# Patient Record
Sex: Female | Born: 1935 | Race: White | Hispanic: No | Smoking: Never smoker
Health system: Southern US, Community
[De-identification: ages and names within clinical notes are randomized; demographics above are authoritative.]

## PROBLEM LIST (undated history)

## (undated) DIAGNOSIS — D649 Anemia, unspecified: Secondary | ICD-10-CM

## (undated) DIAGNOSIS — M1990 Unspecified osteoarthritis, unspecified site: Secondary | ICD-10-CM

## (undated) DIAGNOSIS — F0390 Unspecified dementia without behavioral disturbance: Secondary | ICD-10-CM

## (undated) DIAGNOSIS — I252 Old myocardial infarction: Secondary | ICD-10-CM

## (undated) DIAGNOSIS — M6282 Rhabdomyolysis: Secondary | ICD-10-CM

## (undated) DIAGNOSIS — E119 Type 2 diabetes mellitus without complications: Secondary | ICD-10-CM

## (undated) DIAGNOSIS — I10 Essential (primary) hypertension: Secondary | ICD-10-CM

## (undated) DIAGNOSIS — E785 Hyperlipidemia, unspecified: Secondary | ICD-10-CM

## (undated) HISTORY — PX: APPENDECTOMY: SHX54

## (undated) HISTORY — DX: Hyperlipidemia, unspecified: E78.5

## (undated) HISTORY — DX: Old myocardial infarction: I25.2

## (undated) HISTORY — PX: OTHER SURGICAL HISTORY: SHX169

## (undated) HISTORY — DX: Essential (primary) hypertension: I10

---

## 1998-02-28 ENCOUNTER — Encounter: Payer: Self-pay | Admitting: Orthopedic Surgery

## 1998-02-28 ENCOUNTER — Inpatient Hospital Stay (HOSPITAL_COMMUNITY): Admission: EM | Admit: 1998-02-28 | Discharge: 1998-03-06 | Payer: Self-pay | Admitting: Emergency Medicine

## 1998-02-28 ENCOUNTER — Encounter: Payer: Self-pay | Admitting: *Deleted

## 1998-03-02 ENCOUNTER — Encounter: Payer: Self-pay | Admitting: Orthopedic Surgery

## 1998-03-06 ENCOUNTER — Inpatient Hospital Stay (HOSPITAL_COMMUNITY)
Admission: RE | Admit: 1998-03-06 | Discharge: 1998-04-09 | Payer: Self-pay | Admitting: Physical Medicine and Rehabilitation

## 1998-03-16 ENCOUNTER — Encounter: Payer: Self-pay | Admitting: Gastroenterology

## 1998-03-30 ENCOUNTER — Encounter: Payer: Self-pay | Admitting: Physical Medicine and Rehabilitation

## 1998-05-15 ENCOUNTER — Encounter: Payer: Self-pay | Admitting: Orthopedic Surgery

## 1998-05-15 ENCOUNTER — Inpatient Hospital Stay (HOSPITAL_COMMUNITY): Admission: RE | Admit: 1998-05-15 | Discharge: 1998-05-18 | Payer: Self-pay | Admitting: Orthopedic Surgery

## 1998-05-18 ENCOUNTER — Inpatient Hospital Stay (HOSPITAL_COMMUNITY)
Admission: RE | Admit: 1998-05-18 | Discharge: 1998-05-26 | Payer: Self-pay | Admitting: Physical Medicine & Rehabilitation

## 1999-02-05 ENCOUNTER — Other Ambulatory Visit: Admission: RE | Admit: 1999-02-05 | Discharge: 1999-02-05 | Payer: Self-pay | Admitting: Gastroenterology

## 1999-04-29 ENCOUNTER — Encounter: Admission: RE | Admit: 1999-04-29 | Discharge: 1999-04-29 | Payer: Self-pay | Admitting: Gastroenterology

## 1999-04-29 ENCOUNTER — Encounter: Payer: Self-pay | Admitting: Gastroenterology

## 2000-04-14 ENCOUNTER — Other Ambulatory Visit: Admission: RE | Admit: 2000-04-14 | Discharge: 2000-04-14 | Payer: Self-pay | Admitting: Gastroenterology

## 2000-05-11 ENCOUNTER — Encounter: Payer: Self-pay | Admitting: Gastroenterology

## 2000-05-11 ENCOUNTER — Encounter: Admission: RE | Admit: 2000-05-11 | Discharge: 2000-05-11 | Payer: Self-pay | Admitting: Gastroenterology

## 2003-09-04 ENCOUNTER — Other Ambulatory Visit: Admission: RE | Admit: 2003-09-04 | Discharge: 2003-09-04 | Payer: Self-pay | Admitting: Gastroenterology

## 2004-03-23 ENCOUNTER — Encounter: Admission: RE | Admit: 2004-03-23 | Discharge: 2004-03-23 | Payer: Self-pay | Admitting: Gastroenterology

## 2004-03-24 ENCOUNTER — Ambulatory Visit (HOSPITAL_COMMUNITY): Admission: RE | Admit: 2004-03-24 | Discharge: 2004-03-24 | Payer: Self-pay | Admitting: General Surgery

## 2004-03-25 ENCOUNTER — Ambulatory Visit (HOSPITAL_COMMUNITY): Admission: RE | Admit: 2004-03-25 | Discharge: 2004-03-25 | Payer: Self-pay | Admitting: Gastroenterology

## 2006-05-30 ENCOUNTER — Other Ambulatory Visit: Admission: RE | Admit: 2006-05-30 | Discharge: 2006-05-30 | Payer: Self-pay | Admitting: Gastroenterology

## 2006-06-06 ENCOUNTER — Encounter: Admission: RE | Admit: 2006-06-06 | Discharge: 2006-06-06 | Payer: Self-pay | Admitting: Gastroenterology

## 2008-02-08 HISTORY — PX: OTHER SURGICAL HISTORY: SHX169

## 2008-05-08 ENCOUNTER — Inpatient Hospital Stay (HOSPITAL_COMMUNITY): Admission: EM | Admit: 2008-05-08 | Discharge: 2008-05-19 | Payer: Self-pay | Admitting: Emergency Medicine

## 2008-05-08 ENCOUNTER — Encounter (INDEPENDENT_AMBULATORY_CARE_PROVIDER_SITE_OTHER): Payer: Self-pay | Admitting: Cardiology

## 2008-05-11 ENCOUNTER — Encounter (INDEPENDENT_AMBULATORY_CARE_PROVIDER_SITE_OTHER): Payer: Self-pay | Admitting: Cardiology

## 2008-05-16 ENCOUNTER — Ambulatory Visit: Payer: Self-pay | Admitting: Physical Medicine & Rehabilitation

## 2008-06-04 ENCOUNTER — Ambulatory Visit: Payer: Self-pay | Admitting: Specialist

## 2008-08-12 ENCOUNTER — Encounter: Admission: RE | Admit: 2008-08-12 | Discharge: 2008-08-12 | Payer: Self-pay | Admitting: Gastroenterology

## 2009-09-01 ENCOUNTER — Encounter: Admission: RE | Admit: 2009-09-01 | Discharge: 2009-09-01 | Payer: Self-pay | Admitting: Geriatric Medicine

## 2010-03-15 ENCOUNTER — Other Ambulatory Visit: Payer: Self-pay | Admitting: Geriatric Medicine

## 2010-03-15 DIAGNOSIS — N289 Disorder of kidney and ureter, unspecified: Secondary | ICD-10-CM

## 2010-03-19 ENCOUNTER — Ambulatory Visit
Admission: RE | Admit: 2010-03-19 | Discharge: 2010-03-19 | Disposition: A | Discharge status: Home / Self Care | Payer: MEDICARE | Source: Ambulatory Visit | Attending: Geriatric Medicine | Admitting: Geriatric Medicine

## 2010-03-19 DIAGNOSIS — N289 Disorder of kidney and ureter, unspecified: Secondary | ICD-10-CM

## 2010-05-19 LAB — BASIC METABOLIC PANEL
BUN: 22 mg/dL (ref 6–23)
CO2: 25 mEq/L (ref 19–32)
CO2: 25 mEq/L (ref 19–32)
Calcium: 8.6 mg/dL (ref 8.4–10.5)
Calcium: 8.9 mg/dL (ref 8.4–10.5)
Chloride: 97 mEq/L (ref 96–112)
Creatinine, Ser: 2.03 mg/dL — ABNORMAL HIGH (ref 0.4–1.2)
Creatinine, Ser: 2.1 mg/dL — ABNORMAL HIGH (ref 0.4–1.2)
GFR calc Af Amer: 27 mL/min — ABNORMAL LOW (ref >60)
GFR calc Af Amer: 28 mL/min — ABNORMAL LOW (ref >60)
GFR calc non Af Amer: 22 mL/min — ABNORMAL LOW (ref >60)
GFR calc non Af Amer: 23 mL/min — ABNORMAL LOW (ref >60)
Glucose, Bld: 104 mg/dL — ABNORMAL HIGH (ref 70–99)
Glucose, Bld: 104 mg/dL — ABNORMAL HIGH (ref 70–99)
Glucose, Bld: 111 mg/dL — ABNORMAL HIGH (ref 70–99)
Potassium: 4.4 mEq/L (ref 3.5–5.1)
Sodium: 133 mEq/L — ABNORMAL LOW (ref 135–145)
Sodium: 135 mEq/L (ref 135–145)
Sodium: 138 mEq/L (ref 135–145)

## 2010-05-19 LAB — CARDIAC PANEL(CRET KIN+CKTOT+MB+TROPI)
CK, MB: 47.5 ng/mL — ABNORMAL HIGH (ref 0.3–4.0)
CK, MB: 93.4 ng/mL — ABNORMAL HIGH (ref 0.3–4.0)
Relative Index: 12.6 — ABNORMAL HIGH (ref 0.0–2.5)
Relative Index: 8.3 — ABNORMAL HIGH (ref 0.0–2.5)
Relative Index: 9.8 — ABNORMAL HIGH (ref 0.0–2.5)
Total CK: 573 U/L — ABNORMAL HIGH (ref 7–177)
Troponin I: 1.54 ng/mL (ref 0.00–0.06)
Troponin I: 20.76 ng/mL (ref 0.00–0.06)
Troponin I: 29.9 ng/mL (ref 0.00–0.06)
Troponin I: 8.6 ng/mL (ref 0.00–0.06)

## 2010-05-19 LAB — CBC
HCT: 28.6 % — ABNORMAL LOW (ref 36.0–46.0)
HCT: 29.2 % — ABNORMAL LOW (ref 36.0–46.0)
HCT: 32.9 % — ABNORMAL LOW (ref 36.0–46.0)
HCT: 34.8 % — ABNORMAL LOW (ref 36.0–46.0)
HCT: 38.8 % (ref 36.0–46.0)
HCT: 40.2 % (ref 36.0–46.0)
Hemoglobin: 10.1 g/dL — ABNORMAL LOW (ref 12.0–15.0)
Hemoglobin: 10.2 g/dL — ABNORMAL LOW (ref 12.0–15.0)
Hemoglobin: 11.1 g/dL — ABNORMAL LOW (ref 12.0–15.0)
Hemoglobin: 11.7 g/dL — ABNORMAL LOW (ref 12.0–15.0)
Hemoglobin: 13 g/dL (ref 12.0–15.0)
Hemoglobin: 13.3 g/dL (ref 12.0–15.0)
Hemoglobin: 9.9 g/dL — ABNORMAL LOW (ref 12.0–15.0)
MCHC: 31.9 g/dL (ref 30.0–36.0)
MCHC: 33.7 g/dL (ref 30.0–36.0)
MCHC: 34 g/dL (ref 30.0–36.0)
MCHC: 34.3 g/dL (ref 30.0–36.0)
MCHC: 34.5 g/dL (ref 30.0–36.0)
MCV: 86 fL (ref 78.0–100.0)
MCV: 86.4 fL (ref 78.0–100.0)
MCV: 86.5 fL (ref 78.0–100.0)
MCV: 86.8 fL (ref 78.0–100.0)
MCV: 86.8 fL (ref 78.0–100.0)
MCV: 87.3 fL (ref 78.0–100.0)
Platelets: 215 10*3/uL (ref 150–400)
Platelets: 218 10*3/uL (ref 150–400)
Platelets: 232 10*3/uL (ref 150–400)
Platelets: 241 10*3/uL (ref 150–400)
Platelets: 257 10*3/uL (ref 150–400)
Platelets: 258 10*3/uL (ref 150–400)
Platelets: 260 10*3/uL (ref 150–400)
RBC: 3.16 MIL/uL — ABNORMAL LOW (ref 3.87–5.11)
RBC: 3.39 MIL/uL — ABNORMAL LOW (ref 3.87–5.11)
RBC: 3.44 MIL/uL — ABNORMAL LOW (ref 3.87–5.11)
RBC: 3.79 MIL/uL — ABNORMAL LOW (ref 3.87–5.11)
RBC: 4.3 MIL/uL (ref 3.87–5.11)
RBC: 4.65 MIL/uL (ref 3.87–5.11)
RDW: 15.1 % (ref 11.5–15.5)
RDW: 15.4 % (ref 11.5–15.5)
RDW: 15.5 % (ref 11.5–15.5)
RDW: 15.9 % — ABNORMAL HIGH (ref 11.5–15.5)
RDW: 16 % — ABNORMAL HIGH (ref 11.5–15.5)
WBC: 12.2 10*3/uL — ABNORMAL HIGH (ref 4.0–10.5)
WBC: 5.5 10*3/uL (ref 4.0–10.5)
WBC: 5.5 10*3/uL (ref 4.0–10.5)
WBC: 6.1 10*3/uL (ref 4.0–10.5)
WBC: 7.1 10*3/uL (ref 4.0–10.5)
WBC: 7.3 10*3/uL (ref 4.0–10.5)
WBC: 7.7 10*3/uL (ref 4.0–10.5)

## 2010-05-19 LAB — URINE CULTURE
Colony Count: NO GROWTH
Culture: NO GROWTH

## 2010-05-19 LAB — GLUCOSE, CAPILLARY
Glucose-Capillary: 102 mg/dL — ABNORMAL HIGH (ref 70–99)
Glucose-Capillary: 106 mg/dL — ABNORMAL HIGH (ref 70–99)
Glucose-Capillary: 107 mg/dL — ABNORMAL HIGH (ref 70–99)
Glucose-Capillary: 110 mg/dL — ABNORMAL HIGH (ref 70–99)
Glucose-Capillary: 111 mg/dL — ABNORMAL HIGH (ref 70–99)
Glucose-Capillary: 111 mg/dL — ABNORMAL HIGH (ref 70–99)
Glucose-Capillary: 112 mg/dL — ABNORMAL HIGH (ref 70–99)
Glucose-Capillary: 112 mg/dL — ABNORMAL HIGH (ref 70–99)
Glucose-Capillary: 112 mg/dL — ABNORMAL HIGH (ref 70–99)
Glucose-Capillary: 115 mg/dL — ABNORMAL HIGH (ref 70–99)
Glucose-Capillary: 116 mg/dL — ABNORMAL HIGH (ref 70–99)
Glucose-Capillary: 125 mg/dL — ABNORMAL HIGH (ref 70–99)
Glucose-Capillary: 130 mg/dL — ABNORMAL HIGH (ref 70–99)
Glucose-Capillary: 134 mg/dL — ABNORMAL HIGH (ref 70–99)
Glucose-Capillary: 135 mg/dL — ABNORMAL HIGH (ref 70–99)
Glucose-Capillary: 135 mg/dL — ABNORMAL HIGH (ref 70–99)
Glucose-Capillary: 146 mg/dL — ABNORMAL HIGH (ref 70–99)
Glucose-Capillary: 170 mg/dL — ABNORMAL HIGH (ref 70–99)
Glucose-Capillary: 181 mg/dL — ABNORMAL HIGH (ref 70–99)
Glucose-Capillary: 186 mg/dL — ABNORMAL HIGH (ref 70–99)
Glucose-Capillary: 194 mg/dL — ABNORMAL HIGH (ref 70–99)
Glucose-Capillary: 206 mg/dL — ABNORMAL HIGH (ref 70–99)
Glucose-Capillary: 220 mg/dL — ABNORMAL HIGH (ref 70–99)
Glucose-Capillary: 228 mg/dL — ABNORMAL HIGH (ref 70–99)
Glucose-Capillary: 98 mg/dL (ref 70–99)

## 2010-05-19 LAB — HEPARIN LEVEL (UNFRACTIONATED)
Heparin Unfractionated: 0.4 IU/mL (ref 0.30–0.70)
Heparin Unfractionated: 0.6 IU/mL (ref 0.30–0.70)
Heparin Unfractionated: <0.1 IU/mL — ABNORMAL LOW (ref 0.30–0.70)
Heparin Unfractionated: <0.1 IU/mL — ABNORMAL LOW (ref 0.30–0.70)
Heparin Unfractionated: <0.1 IU/mL — ABNORMAL LOW (ref 0.30–0.70)
Heparin Unfractionated: <0.1 IU/mL — ABNORMAL LOW (ref 0.30–0.70)
Heparin Unfractionated: <0.1 IU/mL — ABNORMAL LOW (ref 0.30–0.70)

## 2010-05-19 LAB — URINALYSIS, ROUTINE W REFLEX MICROSCOPIC
Bilirubin Urine: NEGATIVE
Glucose, UA: NEGATIVE mg/dL
Ketones, ur: NEGATIVE mg/dL
Nitrite: NEGATIVE
Protein, ur: 100 mg/dL — AB
Protein, ur: >300 mg/dL — AB
Urobilinogen, UA: 0.2 mg/dL (ref 0.0–1.0)
pH: 7.5 (ref 5.0–8.0)

## 2010-05-19 LAB — COMPREHENSIVE METABOLIC PANEL
ALT: 11 U/L (ref 0–35)
ALT: 15 U/L (ref 0–35)
AST: 25 U/L (ref 0–37)
AST: 56 U/L — ABNORMAL HIGH (ref 0–37)
Albumin: 2.8 g/dL — ABNORMAL LOW (ref 3.5–5.2)
Albumin: 3.7 g/dL (ref 3.5–5.2)
Alkaline Phosphatase: 63 U/L (ref 39–117)
Calcium: 9.3 mg/dL (ref 8.4–10.5)
Chloride: 101 mEq/L (ref 96–112)
Creatinine, Ser: 1.71 mg/dL — ABNORMAL HIGH (ref 0.4–1.2)
GFR calc Af Amer: 29 mL/min — ABNORMAL LOW (ref >60)
GFR calc Af Amer: 34 mL/min — ABNORMAL LOW (ref >60)
Potassium: 4.1 mEq/L (ref 3.5–5.1)
Potassium: 4.8 mEq/L (ref 3.5–5.1)
Sodium: 137 mEq/L (ref 135–145)
Sodium: 138 mEq/L (ref 135–145)
Total Bilirubin: 1.1 mg/dL (ref 0.3–1.2)
Total Protein: 6.9 g/dL (ref 6.0–8.3)

## 2010-05-19 LAB — POCT I-STAT, CHEM 8
BUN: 18 mg/dL (ref 6–23)
Chloride: 106 mEq/L (ref 96–112)
Creatinine, Ser: 2.1 mg/dL — ABNORMAL HIGH (ref 0.4–1.2)
Glucose, Bld: 189 mg/dL — ABNORMAL HIGH (ref 70–99)
TCO2: 24 mmol/L (ref 0–100)

## 2010-05-19 LAB — URINE MICROSCOPIC-ADD ON

## 2010-05-19 LAB — APTT: aPTT: 29 seconds (ref 24–37)

## 2010-05-19 LAB — POCT CARDIAC MARKERS
Myoglobin, poc: 388 ng/mL (ref 12–200)
Troponin i, poc: <0.05 ng/mL (ref 0.00–0.09)

## 2010-05-19 LAB — DIFFERENTIAL
Eosinophils Relative: 1 % (ref 0–5)
Lymphocytes Relative: 13 % (ref 12–46)
Lymphs Abs: 1.5 10*3/uL (ref 0.7–4.0)
Monocytes Absolute: 0.9 10*3/uL (ref 0.1–1.0)
Neutro Abs: 9.7 10*3/uL — ABNORMAL HIGH (ref 1.7–7.7)

## 2010-05-19 LAB — CK TOTAL AND CKMB (NOT AT ARMC)
CK, MB: 33.4 ng/mL — ABNORMAL HIGH (ref 0.3–4.0)
Total CK: 236 U/L — ABNORMAL HIGH (ref 7–177)

## 2010-05-19 LAB — PROTIME-INR: INR: 1 (ref 0.00–1.49)

## 2010-05-19 LAB — HEMOGLOBIN A1C: Hgb A1c MFr Bld: 6.4 % — ABNORMAL HIGH (ref 4.6–6.1)

## 2010-06-01 ENCOUNTER — Other Ambulatory Visit: Payer: Self-pay | Admitting: Geriatric Medicine

## 2010-06-03 ENCOUNTER — Inpatient Hospital Stay: Admission: RE | Admit: 2010-06-03 | Payer: MEDICARE | Source: Ambulatory Visit
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
## 2010-06-25 NOTE — Op Note (Signed)
NAME:  [name], [name] NO.:  [id number]   MEDICAL RECORD NO.:  [id number]          PATIENT TYPE:  AMB   LOCATION:  ENDO                         FACILITY:  [facility]   PHYSICIAN:  Danise Edge, M.D.   DATE OF BIRTH:  06-Oct-1921   DATE OF PROCEDURE:  03/25/2004  DATE OF DISCHARGE:                                 OPERATIVE REPORT   PROCEDURE:  Endoscopic retrograde cholangiopancreatography with endoscopic  sphincterotomy and common bile duct stone removal.   HISTORY:  [name] is an 77 year old female born May 14, 1921.  When [name] developed biliary colic type pain with elevation in her  bilirubin and liver transaminases, she underwent an abdominal ultrasound  which showed gallstones and dilation of the common bile duct. Her magnetic  resonant cholangiopancreatography performed yesterday morning showed a stone  in the common bile duct. [name] is scheduled to undergo endoscopic  removal of the common bile duct stone.   ALLERGIES:  CODEINE and SULFA.   CURRENT MEDICATIONS:  1.  Glucophage 500 mg twice a day.  2.  Avandia 2 mg twice a day.  3.  Tylenol p.r.n.  4.  Xanax 0.25 mg p.r.n.  5.  Senokot-S p.r.n.  6.  Dyazide 1 tablet daily.   PAST MEDICAL - SURGICAL HISTORY:  Tonsillectomy, appendectomy C-section,  right oophorectomy, left hip replacement with bone grafting, type 2 diabetes  mellitus, anemia with guaiac negative stool, azotemia, hypercholesterolemia,  psoriasis with psoriatic arthritis, fibromyalgia syndrome,  tension type  headaches, degenerative joint disease, anxiety, hypertension, fractured left  hip with hip replacement and bone grafting in 2000.   FAMILY HISTORY:  Noncontributory.   SOCIAL HISTORY:  [name] is a native of [place].  She is a retired  from part-time work at a nursing home. Her husband died in 22 after 50  years of marriage.  She currently lives alone.   ENDOSCOPIST:  Danise Edge, M.D.   PREMEDICATION:  Versed 5 mg, Demerol 50 mg.   PROCEDURE:  After obtaining informed consent, [name] was placed in the  prone position on the fluoroscopy table. I administered intravenous Demerol  and intravenous Versed to achieve conscious sedation for the procedure. The  patient's blood pressure, oxygen saturation and cardiac rhythm were  monitored throughout the procedure and documented in the medical record.   The Olympus duodenoscope was passed through the posterior hypopharynx, down  the esophagus and into the stomach without difficulty. A normal-appearing  pylorus was intubated and the endoscope advanced to the second portion of  the duodenum without examining the duodenal bulb. I was able to visualize a  normal appearing major papilla and minor papilla.   Using the guidewire loaded sphincterotome, the pancreatic duct was  cannulated and the distal pancreatic duct appeared normal after injecting a  small amount of contrast.   The sphincterotome was then used to freely cannulate the common bile duct. A  guidewire was placed into the proximal hepatic ductal system. Contrast was  injected revealing a small stone in the common bile duct. The gallbladder  was also filled  with contrast revealing  multiple stones in the gallbladder.   An endoscopic sphincterotomy was performed without apparent complications.  Using the 15 mm balloon catheter, the stone was easily removed from the  distal common bile duct. A balloon occlusion cholangiogram was then  performed revealing no other stones in the biliary ductal system.  I did not  see any strictures in the biliary ductal system.   ASSESSMENT:  Gallstones complicated by cholelithiasis and mild gallstone  pancreatitis.  Common bile duct stone removal successfully accomplished  following endoscopic sphincterotomy and 15 mm balloon catheter removal of  the common bile duct stone.   [name] would prefer not being admitted to the hospital  for  cholecystectomy this week. She would prefer undergoing cholecystectomy next  week. She has seen Dr. Avel Peace.      MJ/MEDQ  D:  03/25/2004  T:  03/25/2004  Job:  [id number]   cc:   Tasia Catchings, M.D.  [address]  [place]  [place] [address]  Fax: [phone]   Adolph Pollack, M.D.  [address], [address]  [place]  [place] [address]

## 2010-07-14 ENCOUNTER — Ambulatory Visit
Admission: RE | Admit: 2010-07-14 | Discharge: 2010-07-14 | Disposition: A | Discharge status: Home / Self Care | Payer: Medicare Other | Source: Ambulatory Visit | Attending: Geriatric Medicine | Admitting: Geriatric Medicine

## 2010-09-15 ENCOUNTER — Other Ambulatory Visit: Payer: Self-pay | Admitting: Geriatric Medicine

## 2010-09-15 DIAGNOSIS — Z1231 Encounter for screening mammogram for malignant neoplasm of breast: Secondary | ICD-10-CM

## 2010-10-05 ENCOUNTER — Ambulatory Visit
Admission: RE | Admit: 2010-10-05 | Discharge: 2010-10-05 | Disposition: A | Discharge status: Home / Self Care | Payer: Medicare Other | Source: Ambulatory Visit | Attending: Geriatric Medicine | Admitting: Geriatric Medicine

## 2010-10-05 DIAGNOSIS — Z1231 Encounter for screening mammogram for malignant neoplasm of breast: Secondary | ICD-10-CM

## 2012-04-03 ENCOUNTER — Other Ambulatory Visit: Payer: Self-pay | Admitting: Geriatric Medicine

## 2012-05-07 ENCOUNTER — Ambulatory Visit
Admission: RE | Admit: 2012-05-07 | Discharge: 2012-05-07 | Disposition: A | Discharge status: Home / Self Care | Payer: Medicare Other | Source: Ambulatory Visit | Attending: Geriatric Medicine | Admitting: Geriatric Medicine

## 2012-05-07 DIAGNOSIS — Z1231 Encounter for screening mammogram for malignant neoplasm of breast: Secondary | ICD-10-CM

## 2012-11-26 ENCOUNTER — Ambulatory Visit (INDEPENDENT_AMBULATORY_CARE_PROVIDER_SITE_OTHER): Payer: Medicare Other | Admitting: Podiatry

## 2012-11-26 ENCOUNTER — Encounter: Payer: Self-pay | Admitting: Podiatry

## 2012-11-26 VITALS — BP 101/72 | HR 66 | Resp 16 | Ht 61.0 in | Wt 119.0 lb

## 2012-11-26 DIAGNOSIS — B351 Tinea unguium: Secondary | ICD-10-CM

## 2012-11-26 DIAGNOSIS — M79609 Pain in unspecified limb: Secondary | ICD-10-CM

## 2012-11-26 NOTE — Progress Notes (Signed)
Aliene presents today as an 79 year old white female a chief complaint of painful toenails one through 5 bilaterally.  Objective: Pulses are palpable bilateral lower extremity. Nails are thick yellow dystrophic clinically mycotic and painful on palpation as well as debridement.  Assessment: Pain in limb secondary to onychomycosis 1 through 5 bilaterally.  Plan: Debridement of nails 1 through 5 bilateral is a covered service secondary to pain.

## 2013-02-01 ENCOUNTER — Encounter (INDEPENDENT_AMBULATORY_CARE_PROVIDER_SITE_OTHER): Payer: Self-pay

## 2013-02-01 ENCOUNTER — Encounter: Payer: Self-pay | Admitting: Cardiology

## 2013-02-01 ENCOUNTER — Ambulatory Visit (HOSPITAL_COMMUNITY): Payer: Medicare Other | Attending: Cardiology

## 2013-02-01 DIAGNOSIS — E1159 Type 2 diabetes mellitus with other circulatory complications: Secondary | ICD-10-CM

## 2013-02-01 DIAGNOSIS — I739 Peripheral vascular disease, unspecified: Secondary | ICD-10-CM

## 2013-02-01 DIAGNOSIS — I798 Other disorders of arteries, arterioles and capillaries in diseases classified elsewhere: Secondary | ICD-10-CM | POA: Insufficient documentation

## 2013-02-07 HISTORY — PX: OTHER SURGICAL HISTORY: SHX169

## 2013-02-25 ENCOUNTER — Encounter: Payer: Self-pay | Admitting: Podiatry

## 2013-02-25 ENCOUNTER — Ambulatory Visit (INDEPENDENT_AMBULATORY_CARE_PROVIDER_SITE_OTHER): Payer: Medicare Other | Admitting: Podiatry

## 2013-02-25 VITALS — BP 153/72 | HR 66 | Resp 20

## 2013-02-25 DIAGNOSIS — B351 Tinea unguium: Secondary | ICD-10-CM

## 2013-02-25 DIAGNOSIS — M79609 Pain in unspecified limb: Secondary | ICD-10-CM

## 2013-02-25 NOTE — Progress Notes (Signed)
Trim the nails. They're long and painful in rub in my shoes.  Objective: Vital signs are stable she is alert and oriented x3. Pulses are palpable bilateral. Nails are thick yellow dystrophic clinically mycotic and painful on palpation. She also has active psoriasis.  Assessment: Pain in limb secondary to onychomycosis and psoriatic nail disease bilateral foot.  Plan: Debridement of nails 1 through 5 bilateral is cover service secondary to pain followup with her in 3 months

## 2013-05-21 ENCOUNTER — Other Ambulatory Visit: Payer: Self-pay

## 2013-05-21 DIAGNOSIS — Z1231 Encounter for screening mammogram for malignant neoplasm of breast: Secondary | ICD-10-CM

## 2013-05-27 ENCOUNTER — Encounter: Payer: Self-pay | Admitting: Podiatry

## 2013-05-27 ENCOUNTER — Ambulatory Visit (INDEPENDENT_AMBULATORY_CARE_PROVIDER_SITE_OTHER): Payer: Medicare Other | Admitting: Podiatry

## 2013-05-27 VITALS — BP 120/61 | HR 63 | Resp 16

## 2013-05-27 DIAGNOSIS — M79609 Pain in unspecified limb: Secondary | ICD-10-CM

## 2013-05-27 DIAGNOSIS — B351 Tinea unguium: Secondary | ICD-10-CM

## 2013-05-27 NOTE — Progress Notes (Signed)
   Subjective:    Patient ID: [name], female    DOB: 1921/05/02, 80 y.o.   MRN: [id number]  HPI Comments: Trim the nails      Review of Systems     Objective:   Physical Exam: Pulses are strongly palpable bilateral. Nails are thick yellow dystrophic with mycotic and painful palpation.        Assessment & Plan:  Assessment: Pain in limb secondary to onychomycosis.  Plan: Debridement of nails 1 through 5 bilateral is cover service secondary to pain reevaluate in 3 months.

## 2013-06-11 ENCOUNTER — Ambulatory Visit
Admission: RE | Admit: 2013-06-11 | Discharge: 2013-06-11 | Disposition: A | Discharge status: Home / Self Care | Payer: Medicare Other | Source: Ambulatory Visit

## 2013-06-11 ENCOUNTER — Encounter: Payer: Self-pay | Admitting: Cardiology

## 2013-06-11 DIAGNOSIS — Z1231 Encounter for screening mammogram for malignant neoplasm of breast: Secondary | ICD-10-CM

## 2013-07-24 ENCOUNTER — Inpatient Hospital Stay: Payer: Self-pay | Admitting: Internal Medicine

## 2013-07-24 LAB — BASIC METABOLIC PANEL
ANION GAP: 9 (ref 7–16)
BUN: 29 mg/dL — ABNORMAL HIGH (ref 7–18)
CALCIUM: 9 mg/dL (ref 8.5–10.1)
CO2: 25 mmol/L (ref 21–32)
Chloride: 105 mmol/L (ref 98–107)
Creatinine: 2.49 mg/dL — ABNORMAL HIGH (ref 0.60–1.30)
EGFR (African American): 19 — ABNORMAL LOW
GFR CALC NON AF AMER: 16 — AB
GLUCOSE: 181 mg/dL — AB (ref 65–99)
Osmolality: 288 (ref 275–301)
Potassium: 4.2 mmol/L (ref 3.5–5.1)
SODIUM: 139 mmol/L (ref 136–145)

## 2013-07-24 LAB — CBC WITH DIFFERENTIAL/PLATELET
BASOS ABS: 0.1 10*3/uL (ref 0.0–0.1)
Basophil %: 0.4 %
EOS PCT: 0 %
Eosinophil #: 0 10*3/uL (ref 0.0–0.7)
HCT: 32.2 % — ABNORMAL LOW (ref 35.0–47.0)
HGB: 10.2 g/dL — ABNORMAL LOW (ref 12.0–16.0)
LYMPHS ABS: 0.5 10*3/uL — AB (ref 1.0–3.6)
Lymphocyte %: 2.9 %
MCH: 28.9 pg (ref 26.0–34.0)
MCHC: 31.8 g/dL — ABNORMAL LOW (ref 32.0–36.0)
MCV: 91 fL (ref 80–100)
MONO ABS: 1 x10 3/mm — AB (ref 0.2–0.9)
Monocyte %: 6.1 %
NEUTROS ABS: 14.5 10*3/uL — AB (ref 1.4–6.5)
Neutrophil %: 90.6 %
PLATELETS: 201 10*3/uL (ref 150–440)
RBC: 3.54 10*6/uL — ABNORMAL LOW (ref 3.80–5.20)
RDW: 14.3 % (ref 11.5–14.5)
WBC: 16 10*3/uL — AB (ref 3.6–11.0)

## 2013-07-24 LAB — CREATININE, SERUM
CREATININE: 2.13 mg/dL — AB (ref 0.60–1.30)
EGFR (Non-African Amer.): 20 — ABNORMAL LOW
GFR CALC AF AMER: 23 — AB

## 2013-07-24 LAB — URINALYSIS, COMPLETE
Bacteria: NONE SEEN
Bilirubin,UR: NEGATIVE
Glucose,UR: NEGATIVE mg/dL (ref 0–75)
Ketone: NEGATIVE
Leukocyte Esterase: NEGATIVE
Nitrite: NEGATIVE
PH: 5 (ref 4.5–8.0)
Protein: 100
RBC,UR: NONE SEEN /HPF (ref 0–5)
SPECIFIC GRAVITY: 1.016 (ref 1.003–1.030)
Squamous Epithelial: NONE SEEN
WBC UR: 2 /HPF (ref 0–5)

## 2013-07-24 LAB — PROTIME-INR
INR: 1.1
Prothrombin Time: 13.8 secs (ref 11.5–14.7)

## 2013-07-24 LAB — HEMOGLOBIN A1C: Hemoglobin A1C: 5.5 % (ref 4.2–6.3)

## 2013-07-24 LAB — CK: CK, Total: 1907 U/L — ABNORMAL HIGH

## 2013-07-25 LAB — CBC WITH DIFFERENTIAL/PLATELET
Basophil #: 0 10*3/uL (ref 0.0–0.1)
Basophil %: 0.3 %
Eosinophil #: 0 10*3/uL (ref 0.0–0.7)
Eosinophil %: 0.2 %
HCT: 30.1 % — ABNORMAL LOW (ref 35.0–47.0)
HGB: 9.8 g/dL — ABNORMAL LOW (ref 12.0–16.0)
Lymphocyte #: 1.1 10*3/uL (ref 1.0–3.6)
Lymphocyte %: 7.7 %
MCH: 29.6 pg (ref 26.0–34.0)
MCHC: 32.7 g/dL (ref 32.0–36.0)
MCV: 91 fL (ref 80–100)
Monocyte #: 0.9 x10 3/mm (ref 0.2–0.9)
Monocyte %: 6.6 %
Neutrophil #: 11.8 10*3/uL — ABNORMAL HIGH (ref 1.4–6.5)
Neutrophil %: 85.2 %
Platelet: 169 10*3/uL (ref 150–440)
RBC: 3.32 10*6/uL — ABNORMAL LOW (ref 3.80–5.20)
RDW: 14.3 % (ref 11.5–14.5)
WBC: 13.8 10*3/uL — ABNORMAL HIGH (ref 3.6–11.0)

## 2013-07-25 LAB — COMPREHENSIVE METABOLIC PANEL
AST: 54 U/L — AB (ref 15–37)
Albumin: 3.4 g/dL (ref 3.4–5.0)
Alkaline Phosphatase: 44 U/L — ABNORMAL LOW
Anion Gap: 9 (ref 7–16)
BILIRUBIN TOTAL: 0.8 mg/dL (ref 0.2–1.0)
BUN: 27 mg/dL — ABNORMAL HIGH (ref 7–18)
Calcium, Total: 9.3 mg/dL (ref 8.5–10.1)
Chloride: 104 mmol/L (ref 98–107)
Co2: 25 mmol/L (ref 21–32)
Creatinine: 1.98 mg/dL — ABNORMAL HIGH (ref 0.60–1.30)
EGFR (African American): 25 — ABNORMAL LOW
EGFR (Non-African Amer.): 22 — ABNORMAL LOW
Glucose: 123 mg/dL — ABNORMAL HIGH (ref 65–99)
OSMOLALITY: 282 (ref 275–301)
Potassium: 4.1 mmol/L (ref 3.5–5.1)
SGPT (ALT): 18 U/L (ref 12–78)
Sodium: 138 mmol/L (ref 136–145)
TOTAL PROTEIN: 7.1 g/dL (ref 6.4–8.2)

## 2013-07-25 LAB — MAGNESIUM: MAGNESIUM: 2.2 mg/dL

## 2013-07-25 LAB — PROTIME-INR
INR: 1.1
Prothrombin Time: 13.8 secs (ref 11.5–14.7)

## 2013-07-25 LAB — CK: CK, Total: 1172 U/L — ABNORMAL HIGH

## 2013-07-26 LAB — BASIC METABOLIC PANEL
Anion Gap: 5 — ABNORMAL LOW (ref 7–16)
BUN: 24 mg/dL — ABNORMAL HIGH (ref 7–18)
Calcium, Total: 8.5 mg/dL (ref 8.5–10.1)
Chloride: 106 mmol/L (ref 98–107)
Co2: 24 mmol/L (ref 21–32)
Creatinine: 1.79 mg/dL — ABNORMAL HIGH (ref 0.60–1.30)
EGFR (Non-African Amer.): 25 — ABNORMAL LOW
GFR CALC AF AMER: 28 — AB
Glucose: 105 mg/dL — ABNORMAL HIGH (ref 65–99)
Osmolality: 275 (ref 275–301)
Potassium: 4.2 mmol/L (ref 3.5–5.1)
Sodium: 135 mmol/L — ABNORMAL LOW (ref 136–145)

## 2013-07-26 LAB — CBC WITH DIFFERENTIAL/PLATELET
BASOS ABS: 0 10*3/uL (ref 0.0–0.1)
Basophil %: 0.1 %
EOS ABS: 0 10*3/uL (ref 0.0–0.7)
Eosinophil %: 0 %
HCT: 23.9 % — AB (ref 35.0–47.0)
HGB: 7.9 g/dL — ABNORMAL LOW (ref 12.0–16.0)
LYMPHS PCT: 8.9 %
Lymphocyte #: 1 10*3/uL (ref 1.0–3.6)
MCH: 29.8 pg (ref 26.0–34.0)
MCHC: 32.9 g/dL (ref 32.0–36.0)
MCV: 90 fL (ref 80–100)
MONOS PCT: 9.9 %
Monocyte #: 1.1 x10 3/mm — ABNORMAL HIGH (ref 0.2–0.9)
NEUTROS ABS: 8.8 10*3/uL — AB (ref 1.4–6.5)
NEUTROS PCT: 81.1 %
Platelet: 148 10*3/uL — ABNORMAL LOW (ref 150–440)
RBC: 2.64 10*6/uL — ABNORMAL LOW (ref 3.80–5.20)
RDW: 14.2 % (ref 11.5–14.5)
WBC: 10.8 10*3/uL (ref 3.6–11.0)

## 2013-07-27 LAB — BASIC METABOLIC PANEL
Anion Gap: 4 — ABNORMAL LOW (ref 7–16)
BUN: 24 mg/dL — ABNORMAL HIGH (ref 7–18)
Calcium, Total: 8.9 mg/dL (ref 8.5–10.1)
Chloride: 105 mmol/L (ref 98–107)
Co2: 26 mmol/L (ref 21–32)
Creatinine: 1.69 mg/dL — ABNORMAL HIGH (ref 0.60–1.30)
GFR CALC AF AMER: 30 — AB
GFR CALC NON AF AMER: 26 — AB
GLUCOSE: 114 mg/dL — AB (ref 65–99)
Osmolality: 275 (ref 275–301)
POTASSIUM: 4.4 mmol/L (ref 3.5–5.1)
Sodium: 135 mmol/L — ABNORMAL LOW (ref 136–145)

## 2013-07-27 LAB — PLATELET COUNT: PLATELETS: 149 10*3/uL — AB (ref 150–440)

## 2013-07-27 LAB — HEMOGLOBIN: HGB: 10.1 g/dL — AB (ref 12.0–16.0)

## 2013-07-28 LAB — BASIC METABOLIC PANEL
ANION GAP: 8 (ref 7–16)
BUN: 27 mg/dL — AB (ref 7–18)
CALCIUM: 9.1 mg/dL (ref 8.5–10.1)
CHLORIDE: 102 mmol/L (ref 98–107)
Co2: 25 mmol/L (ref 21–32)
Creatinine: 1.68 mg/dL — ABNORMAL HIGH (ref 0.60–1.30)
EGFR (African American): 31 — ABNORMAL LOW
EGFR (Non-African Amer.): 26 — ABNORMAL LOW
Glucose: 129 mg/dL — ABNORMAL HIGH (ref 65–99)
Osmolality: 277 (ref 275–301)
POTASSIUM: 4.4 mmol/L (ref 3.5–5.1)
Sodium: 135 mmol/L — ABNORMAL LOW (ref 136–145)

## 2013-07-28 LAB — HEMOGLOBIN: HGB: 10.5 g/dL — ABNORMAL LOW (ref 12.0–16.0)

## 2013-07-29 ENCOUNTER — Encounter: Payer: Self-pay | Admitting: Internal Medicine

## 2013-07-29 LAB — CK: CK, Total: 72 U/L

## 2013-07-30 ENCOUNTER — Ambulatory Visit: Payer: Medicare Other | Admitting: Cardiology

## 2013-08-05 LAB — URINALYSIS, COMPLETE
BACTERIA: NONE SEEN
BILIRUBIN, UR: NEGATIVE
BLOOD: NEGATIVE
Glucose,UR: NEGATIVE mg/dL (ref 0–75)
Ketone: NEGATIVE
Nitrite: NEGATIVE
Ph: 5 (ref 4.5–8.0)
Protein: 30
RBC,UR: 15 /HPF (ref 0–5)
Specific Gravity: 1.017 (ref 1.003–1.030)
WBC UR: 34 /HPF (ref 0–5)

## 2013-08-07 ENCOUNTER — Ambulatory Visit
Admit: 2013-08-07 | Disposition: A | Discharge status: Home / Self Care | Payer: Self-pay | Attending: Nurse Practitioner | Admitting: Nurse Practitioner

## 2013-08-07 ENCOUNTER — Encounter: Payer: Self-pay | Admitting: Internal Medicine

## 2013-08-07 LAB — URINE CULTURE

## 2013-08-21 ENCOUNTER — Encounter: Payer: Self-pay | Admitting: Cardiology

## 2013-08-26 ENCOUNTER — Ambulatory Visit: Payer: Medicare Other | Admitting: Podiatry

## 2013-08-29 ENCOUNTER — Ambulatory Visit (INDEPENDENT_AMBULATORY_CARE_PROVIDER_SITE_OTHER): Payer: Medicare Other | Admitting: Cardiology

## 2013-08-29 ENCOUNTER — Encounter: Payer: Self-pay | Admitting: Cardiology

## 2013-08-29 VITALS — BP 115/64 | HR 63 | Ht 61.0 in | Wt 119.0 lb

## 2013-08-29 DIAGNOSIS — E785 Hyperlipidemia, unspecified: Secondary | ICD-10-CM | POA: Insufficient documentation

## 2013-08-29 DIAGNOSIS — I10 Essential (primary) hypertension: Secondary | ICD-10-CM | POA: Insufficient documentation

## 2013-08-29 DIAGNOSIS — I252 Old myocardial infarction: Secondary | ICD-10-CM

## 2013-08-29 MED ORDER — ROSUVASTATIN CALCIUM 5 MG PO TABS: 5.0000 mg | ORAL_TABLET | Freq: Every day | ORAL | Status: DC

## 2013-08-29 NOTE — Progress Notes (Signed)
[address] [place], [place]  [address] Phone: [phone] Fax:  [phone]  Date:  08/29/2013   ID:  Denise SalvoMolly P Neal, DOB [birth year], MRN [id number]  PCP:  Ginette OttoSTONEKING,HAL THOMAS, MD   History of Present Illness: Denise AthensMolly P Neal is a 80 y.o. female with  CAD - NSTEMI - 05/2008 - doing well. No chest pain. Plavix for one year total. Stopped. No prior cardiac catheterization (patient opted for noninvasive management). Echocardiogram showed an EF of 45% in 2010. She describes no chest pain with exertion, shortness of breath, no palpitations. She is taking her aspirin. Metoprolol 50 mg twice a day.  DM - She is doing excellently in regards to her diabetes management Dr. Pete GlatterStoneking. Prior Hemoglobin A1c 5.5.  HTN - Wonderful blood pressure. Previously she had been mildly elevated.   CRI - Her creatinine at last check was 2.3. This is stable.   HL - Crestor. LDL 61.    Wt Readings from Last 3 Encounters:  08/29/13 119 lb (53.978 kg)  11/26/12 119 lb (53.978 kg)     No past medical history on file.  No past surgical history on file.  Current Outpatient Prescriptions  Medication Sig Dispense Refill  . acitretin (SORIATANE) 25 MG capsule Take 25 mg by mouth. 1 pill twice a week      . alendronate (FOSAMAX) 70 MG tablet Take 70 mg by mouth once a week. Take with a full glass of water on an empty stomach.      Marland Kitchen. amLODipine (NORVASC) 5 MG tablet Take 5 mg by mouth daily.      Marland Kitchen. aspirin 81 MG tablet Take 81 mg by mouth daily.      . calcium carbonate (OS-CAL) 600 MG TABS tablet Take 600 mg by mouth 2 (two) times daily with a meal.      . donepezil (ARICEPT) 10 MG tablet Take 10 mg by mouth at bedtime as needed.      . ferrous sulfate 325 (65 FE) MG tablet Take 325 mg by mouth daily with breakfast.      . fish oil-omega-3 fatty acids 1000 MG capsule Take 1,000 mg by mouth daily. 2 capsules in morning and 2 in the evening      . metoprolol (LOPRESSOR) 50 MG tablet Take  50 mg by mouth daily.      . metoprolol (LOPRESSOR) 50 MG tablet Take 50 mg by mouth 2 (two) times daily.      Marland Kitchen. omeprazole (PRILOSEC) 40 MG capsule Take 40 mg by mouth daily.      . pioglitazone (ACTOS) 15 MG tablet Take 15 mg by mouth daily.      . rosuvastatin (CRESTOR) 10 MG tablet Take 10 mg by mouth daily.       No current facility-administered medications for this visit.    Allergies:    Allergies  Allergen Reactions  . Codeine   . Sulfa Antibiotics     hives    Social History:  The patient  reports that she has never smoked. She has never used smokeless tobacco. She reports that she does not drink alcohol or use illicit drugs.   No family history on file. noncontributory.  ROS:  Please see the history of present illness.   Currently sitting in wheelchair, weakness, no chest pain, no shortness of breath, no fevers, no chills.  All other systems reviewed and negative.   PHYSICAL EXAM: VS:  BP 115/64  Pulse  63  Ht 5\' 1"  (1.549 m)  Wt 119 lb (53.978 kg)  BMI 22.50 kg/m2 Thin, elderly in no acute distressSitting in wheelchair HEENT: normal, [place]/AT, EOMI Neck: no JVD, normal carotid upstroke, no bruit Cardiac:  normal S1, S2; RRR; no murmur Lungs:  clear to auscultation bilaterally, no wheezing, rhonchi or rales Abd: soft, nontender, no hepatomegaly, no bruits Ext: no edema, 2+ distal pulses Skin: warm and dry GU: deferred Neuro: no focal abnormalities noted, AAO x 3  EKG:  Sinus rhythm no other abnormalities   ASSESSMENT AND PLAN:  1. Old myocardial infarction-doing very well. Secondary prevention. 2010. 2. Hyperlipidemia-continue with Crestor 5 mg. LDL excellent. 3. Hypertension, essential-very well controlled. 4. No real changes, see back in one year at the request of family.  Signed, Donato Schultz, MD [facility]  08/29/2013 5:00 PM

## 2013-08-29 NOTE — Patient Instructions (Signed)
The current medical regimen is effective;  continue present plan and medications.  Follow up in 1 year with Dr Skains.  You will receive a letter in the mail 2 months before you are due.  Please call us when you receive this letter to schedule your follow up appointment.  

## 2013-09-07 ENCOUNTER — Encounter: Payer: Self-pay | Admitting: Internal Medicine

## 2014-01-14 ENCOUNTER — Ambulatory Visit: Payer: Medicare Other | Admitting: Podiatry

## 2014-01-15 ENCOUNTER — Ambulatory Visit: Payer: Medicare Other | Admitting: Podiatry

## 2014-02-03 ENCOUNTER — Ambulatory Visit (INDEPENDENT_AMBULATORY_CARE_PROVIDER_SITE_OTHER): Payer: Medicare Other | Admitting: Podiatry

## 2014-02-03 DIAGNOSIS — M79676 Pain in unspecified toe(s): Secondary | ICD-10-CM

## 2014-02-03 DIAGNOSIS — B351 Tinea unguium: Secondary | ICD-10-CM

## 2014-02-03 NOTE — Progress Notes (Signed)
° °  Subjective:    Patient ID: Denise AthensMolly P Vanderburg, female    DOB: 03/18/1921, 80 y.o.   MRN: [id number]  HPI Comments: Trim the nails      Review of Systems     Objective:   Physical Exam: Pulses are strongly palpable bilateral. Nails are thick yellow dystrophic with mycotic and painful palpation.        Assessment & Plan:  Assessment: Pain in limb secondary to onychomycosis.  Plan: Debridement of nails 1 through 5 bilateral is cover service secondary to pain reevaluate in 3 months.

## 2014-05-31 NOTE — Discharge Summary (Signed)
PATIENT NAME:  [name], [name] DATE OF BIRTH:  February 27, 1921  DATE OF ADMISSION:  07/24/2013 DATE OF DISCHARGE:  07/29/2013  ADMISSION DIAGNOSES:  1.  Displaced intertrochanteric proximal right femur fracture.  2.  Chronic kidney disease.  3.  Diabetes.   DISCHARGE DIAGNOSES: 1.  Femur fracture postop day #4 after a fall.  2.  Rhabdomyolysis secondary to fall.  3.  Acute on chronic renal failure.  4.  Acute posthemorrhagic anemia.  5.  Right intertrochanteric fracture.  6.  Hypotension.   CONSULTATIONS: Dr. Hyacinth MeekerMiller.   PROCEDURES PERFORMED: The patient underwent an ORIF on July 25, 2013.   DIAGNOSTIC DATA: Laboratories at discharge: Sodium 135, potassium 4.4, chloride 102, bicarb 25, BUN 27, creatinine 1.6, glucose 129. CK is 72. Hemoglobin is 10.5.   HOSPITAL COURSE: A 81 year old female status post mechanical fall who unfortunately suffered a hip fracture. For further details, please refer to the H and P. 1.  Hypotension. The patient was noted to be hypotensive in the setting of dehydration and her fall as well as acute on chronic renal failure, resolved with hydration and blood transfusion. She is now back on her hypertensive medications. 2.  Acute on chronic renal failure, improved with IV fluids.  3.  Rhabdomyolysis after fall. Her CK has improved with IV fluids.  4.  Acute posthemorrhagic anemia status post 1 unit of PRBCs with stable hemoglobin.  5.  Diabetes. The patient will be restarted on outpatient medication.  6.  Right intertrochanteric hip fracture. The patient is postop day #4 status post ORIF. DVT prophylaxis as per orthopedic surgery. The patient will be followed up in 2 weeks with ortho.  DISCHARGE MEDICATIONS:  1.  Fish oil 1000 mg b.i.d.  2.  Aspirin 81 mg daily. 3.  Calcium carbonate 600 b.i.d. 4.  Ferrous sulfate 325 t.i.d.  5.  Metoprolol 50 mg b.i.d.  6.  Donepezil 5 mg at bedtime.  7.  Omeprazole 20 mg daily.  8.  Pioglitazone 15 mg daily. 9.   Crestor 5 mg at bedtime.  10.  Acitretin 25 mg t.i.d. Monday, Wednesday, and Friday.  12.  Norvasc 5 mg daily.  13.  Vitamin B12 1000 mcg monthly.  14.  Acetaminophen 325 two tablets q. 4 hours p.r.n. pain.  15.  Alendronate 70 mg weekly.  16.  Docusate 100 mg t.i.d. p.r.n.   The patient's DVT prophylaxis will be written by Dr. Hyacinth MeekerMiller as discharge is still pending.   DISCHARGE DIET: ADA diet. Ensure supplements 3 times a day.   DISCHARGE ACTIVITY: As tolerated.   DISCHARGE REFERRAL: Physical therapy.   DISCHARGE FOLLOWUP: The patient will need to follow up with Dr. Hyacinth MeekerMiller in 2 weeks.  The patient was medically stable for discharge.   TIME SPENT: Approximately 35 minutes.   ____________________________ Denise ContesSital P. Juliene PinaMody, MD spm:sb D: 07/29/2013 11:33:36 ET T: 07/29/2013 11:57:44 ET JOB#: [id number]  cc: Rosealee Recinos P. Juliene PinaMody, MD, <Dictator> Valinda HoarHoward E. Miller, MD Denise ContesSITAL P Fisher Hargadon MD ELECTRONICALLY SIGNED 07/29/2013 13:51

## 2014-05-31 NOTE — Op Note (Signed)
PATIENT NAME:  [name], [name] MR#:  [id number] DATE OF BIRTH:  06-07-1921  DATE OF PROCEDURE:  07/25/2013  PREOPERATIVE DIAGNOSIS:  Comminuted displaced intertrochanteric fracture, right hip.   POSTOPERATIVE DIAGNOSIS:  Comminuted displaced intertrochanteric fracture, right hip.   PROCEDURE PERFORMED: Open reduction and internal fixation of right hip with a Synthes trochanteric fixation nail (125 degree/11 mm rod, 100 mm helical blade, 38 mm distal screw).   SURGEON:  Valinda HoarHoward E. Kort Stettler, M.D.   ASSISTANT:  Horris LatinoLance McGhee, PA student.     ANESTHESIA:  Spinal.   COMPLICATIONS:  None.   DRAINS:  None.   ESTIMATED BLOOD LOSS:  100 mL; replaced none.    DESCRIPTION OF PROCEDURE:  The patient was brought to the operating room. She underwent satisfactory spinal anesthesia and was placed in the supine position and padded appropriately on the fracture table. The left leg was flexed and abducted, and the right leg was placed in traction and internally rotated. Fluoroscopy showed good position of the fracture fragment on AP and lateral views. The right hip was prepped and draped in a sterile fashion and a short longitudinal incision made proximal to the greater trochanter. Dissection was carried out sharply through the subcutaneous tissue and fascia. Guidepin was inserted into the tip of the trochanter and a 17 mm drill used to enlarge the opening in the top of the trochanter. The guidepin was advanced down the shaft and an 125 degree x 11 mm rod was inserted under fluoroscopic control. The guidepin was removed. A second stab wound was made distally and the pin guide apparatus advanced to the lateral cortex and tightened. The guidepin was inserted and was seen to be in excellent position on AP and lateral view. The lateral cortex was drilled and the head and neck were drilled. The 100 mm helical blade was inserted and advanced fully. The traction was released. The construct was tightened. The set screw was then  advanced and tightened down to the helical blade. The guide for the distal screw was inserted and a third stab wound made. The distal screw hole was drilled and filled with a 38 mm screw. Fluoroscopy showed all hardware to be in good position and the fracture to be well reduced. The outriggers were removed. The wounds were irrigated and the fascia closed with 0 Vicryl suture. Subcutaneous tissues were closed with 2-0 Vicryl and the skin was closed with staples. Dry sterile dressing was applied. The patient's leg was taken out of traction and had good motion without any crepitus. She was transferred to her hospital bed and taken to recovery in good condition.     ____________________________ Valinda HoarHoward E. Denys Salinger, MD hem:dmm D: 07/25/2013 11:59:28 ET T: 07/25/2013 12:12:06 ET JOB#: [id number]  cc: Valinda HoarHoward E. Chantille Navarrete, MD, <Dictator> Valinda HoarHOWARD E Summit Borchardt MD ELECTRONICALLY SIGNED 07/25/2013 17:59
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
## 2014-09-25 ENCOUNTER — Ambulatory Visit: Payer: Self-pay | Admitting: Cardiology

## 2014-10-16 ENCOUNTER — Ambulatory Visit (INDEPENDENT_AMBULATORY_CARE_PROVIDER_SITE_OTHER): Payer: Medicare Other | Admitting: Cardiology

## 2014-10-16 ENCOUNTER — Encounter: Payer: Self-pay | Admitting: Cardiology

## 2014-10-16 VITALS — BP 140/84 | HR 73 | Ht 62.0 in | Wt 125.8 lb

## 2014-10-16 DIAGNOSIS — E785 Hyperlipidemia, unspecified: Secondary | ICD-10-CM

## 2014-10-16 DIAGNOSIS — I252 Old myocardial infarction: Secondary | ICD-10-CM | POA: Diagnosis not present

## 2014-10-16 DIAGNOSIS — I10 Essential (primary) hypertension: Secondary | ICD-10-CM

## 2014-10-16 NOTE — Progress Notes (Signed)
[address] [place], [place]  [address] Phone: [phone] Fax:  [phone]  Date:  10/16/2014   ID:  [name], DOB 1921-03-01, MRN [id number]  PCP:  Ginette Otto, MD   History of Present Illness: [name] is a 81 y.o. female with  CAD - NSTEMI - 05/2008 - doing well. No chest pain. Plavix for one year total. Stopped. Continuing with ASA tx.  No prior cardiac catheterization (patient opted for noninvasive management). Echocardiogram showed an EF of 45% in 2010. She describes no chest pain with exertion, shortness of breath, no palpitations. She is taking her aspirin. Metoprolol 50 mg twice a day.  Femur fx 2015. Lives in assisted living, [place], smaller house with 6 people.  DM - She is doing excellently in regards to her diabetes management Dr. Pete Glatter. Prior Hemoglobin A1c 5.5.  HTN - Wonderful blood pressure. Previously she had been mildly elevated.   CRI/CKD 4 - Her creatinine at last check was 2.3. This is stable.   HL - Crestor previously. LDL 61. She is no longer on this medication. Not unreasonable given advanced age.    Wt Readings from Last 3 Encounters:  10/16/14 125 lb 12.8 oz (57.063 kg)  08/29/13 119 lb (53.978 kg)  11/26/12 119 lb (53.978 kg)     Past Medical History  Diagnosis Date  . Old myocardial infarction   . Essential (primary) hypertension   . Hyperlipidemia     No past surgical history on file.  Current Outpatient Prescriptions  Medication Sig Dispense Refill  . acitretin (SORIATANE) 25 MG capsule Take 25 mg by mouth. 1 pill twice a week    . amLODipine (NORVASC) 5 MG tablet Take 5 mg by mouth daily.    Marland Kitchen aspirin 81 MG tablet Take 81 mg by mouth daily.    . calcium carbonate (OS-CAL) 600 MG TABS tablet Take 600 mg by mouth 2 (two) times daily with a meal.    . Cholecalciferol (VITAMIN D) 2000 UNITS CAPS Take 2,000 Units by mouth daily.    . cyanocobalamin 2000 MCG tablet Take 2,000 mcg by mouth 2 (two)  times daily.    Marland Kitchen donepezil (ARICEPT) 10 MG tablet Take 10 mg by mouth at bedtime as needed.    . donepezil (ARICEPT) 5 MG tablet     . fish oil-omega-3 fatty acids 1000 MG capsule Take 1,000 mg by mouth daily. 2 capsules in morning and 2 in the evening    . HYDROcodone-acetaminophen (NORCO) 5-325 MG per tablet Take 1 tablet by mouth every 6 (six) hours as needed for moderate pain.    . metoprolol (LOPRESSOR) 50 MG tablet Take 50 mg by mouth 2 (two) times daily.    . mirtazapine (REMERON) 15 MG tablet     . omeprazole (PRILOSEC) 20 MG capsule     . PE-Shark Liver Oil-Cocoa Buttr (HEMORRHOID RE) Place 1 application rectally 4 (four) times daily as needed (hemorrhoid flare up).    . pioglitazone (ACTOS) 15 MG tablet Take 15 mg by mouth daily.    Marland Kitchen triamcinolone cream (KENALOG) 0.1 % Apply 1 application topically 2 (two) times daily.     No current facility-administered medications for this visit.    Allergies:    Allergies  Allergen Reactions  . Codeine   . Sulfa Antibiotics     hives    Social History:  The patient  reports that she has never smoked. She has never used smokeless  tobacco. She reports that she does not drink alcohol or use illicit drugs.   No family history on file. noncontributory.  ROS:  Please see the history of present illness.   Currently sitting in wheelchair, weakness, no chest pain, no shortness of breath, no fevers, no chills.  All other systems reviewed and negative.   PHYSICAL EXAM: VS:  BP 140/84 mmHg  Pulse 73  Ht  (1.575 m)  Wt 125 lb 12.8 oz (57.063 kg)  BMI 23.00 kg/m2 Thin, elderly in no acute distressWalker HEENT: Left eye redness Neck: no JVD, normal carotid upstroke, no bruit Cardiac:  normal S1, S2; RRR; no murmur Lungs:  clear to auscultation bilaterally, no wheezing, rhonchi or rales Abd: soft, nontender, no hepatomegaly, no bruits Ext: no edema, 2+ distal pulses Skin: warm and dry GU: deferred Neuro: no focal abnormalities noted,  AAO x 3  EKG: Today 10/16/14-sinus rhythm, 74, nonspecific ST-T wave changes, borderline LVH. Personally viewed-prior Sinus rhythm no other abnormalities   ASSESSMENT AND PLAN:  1. Old myocardial infarction-doing very well. Secondary prevention. 2010. 2. Hyperlipidemia-was on Crestor 5 mg. LDL excellent. However, no longer on medication list. This does not seem terribly unreasonable given her age of 53. 3. Hypertension, essential-very well controlled. 4. No real changes, see back in one year at the request of family.  Signed, Donato Schultz, MD [facility]  10/16/2014 9:55 AM

## 2014-10-16 NOTE — Patient Instructions (Signed)
Medication Instructions:  Your physician recommends that you continue on your current medications as directed. Please refer to the Current Medication list given to you today.  Follow-Up: Follow up in 1 year with Dr. Skains.  You will receive a letter in the mail 2 months before you are due.  Please call us when you receive this letter to schedule your follow up appointment.  Thank you for choosing [facility]!!       

## 2015-05-28 ENCOUNTER — Inpatient Hospital Stay
Admission: EM | Admit: 2015-05-28 | Discharge: 2015-05-31 | DRG: 444 | Disposition: A | Discharge status: Home Health | Payer: Medicare Other | Attending: Internal Medicine | Admitting: Internal Medicine

## 2015-05-28 DIAGNOSIS — R531 Weakness: Secondary | ICD-10-CM | POA: Diagnosis not present

## 2015-05-28 DIAGNOSIS — L8990 Pressure ulcer of unspecified site, unspecified stage: Secondary | ICD-10-CM | POA: Insufficient documentation

## 2015-05-28 DIAGNOSIS — Z7982 Long term (current) use of aspirin: Secondary | ICD-10-CM

## 2015-05-28 DIAGNOSIS — L89152 Pressure ulcer of sacral region, stage 2: Secondary | ICD-10-CM | POA: Diagnosis present

## 2015-05-28 DIAGNOSIS — I252 Old myocardial infarction: Secondary | ICD-10-CM

## 2015-05-28 DIAGNOSIS — Z882 Allergy status to sulfonamides status: Secondary | ICD-10-CM

## 2015-05-28 DIAGNOSIS — K810 Acute cholecystitis: Secondary | ICD-10-CM

## 2015-05-28 DIAGNOSIS — I2510 Atherosclerotic heart disease of native coronary artery without angina pectoris: Secondary | ICD-10-CM | POA: Diagnosis present

## 2015-05-28 DIAGNOSIS — E785 Hyperlipidemia, unspecified: Secondary | ICD-10-CM | POA: Diagnosis present

## 2015-05-28 DIAGNOSIS — N183 Chronic kidney disease, stage 3 (moderate): Secondary | ICD-10-CM | POA: Diagnosis present

## 2015-05-28 DIAGNOSIS — F0390 Unspecified dementia without behavioral disturbance: Secondary | ICD-10-CM | POA: Diagnosis present

## 2015-05-28 DIAGNOSIS — I129 Hypertensive chronic kidney disease with stage 1 through stage 4 chronic kidney disease, or unspecified chronic kidney disease: Secondary | ICD-10-CM | POA: Diagnosis present

## 2015-05-28 DIAGNOSIS — K819 Cholecystitis, unspecified: Secondary | ICD-10-CM | POA: Diagnosis present

## 2015-05-28 DIAGNOSIS — Z8249 Family history of ischemic heart disease and other diseases of the circulatory system: Secondary | ICD-10-CM

## 2015-05-28 DIAGNOSIS — Z79899 Other long term (current) drug therapy: Secondary | ICD-10-CM

## 2015-05-28 DIAGNOSIS — Z885 Allergy status to narcotic agent status: Secondary | ICD-10-CM

## 2015-05-28 DIAGNOSIS — N170 Acute kidney failure with tubular necrosis: Secondary | ICD-10-CM | POA: Diagnosis present

## 2015-05-28 DIAGNOSIS — E1122 Type 2 diabetes mellitus with diabetic chronic kidney disease: Secondary | ICD-10-CM | POA: Diagnosis present

## 2015-05-28 DIAGNOSIS — N289 Disorder of kidney and ureter, unspecified: Secondary | ICD-10-CM

## 2015-05-28 DIAGNOSIS — Z66 Do not resuscitate: Secondary | ICD-10-CM | POA: Diagnosis present

## 2015-05-28 DIAGNOSIS — E860 Dehydration: Secondary | ICD-10-CM | POA: Diagnosis present

## 2015-05-28 HISTORY — DX: Type 2 diabetes mellitus without complications: E11.9

## 2015-05-28 LAB — CBC
HCT: 37.6 % (ref 35.0–47.0)
HEMOGLOBIN: 12.7 g/dL (ref 12.0–16.0)
MCH: 29 pg (ref 26.0–34.0)
MCHC: 33.7 g/dL (ref 32.0–36.0)
MCV: 86.1 fL (ref 80.0–100.0)
Platelets: 197 10*3/uL (ref 150–440)
RBC: 4.37 MIL/uL (ref 3.80–5.20)
RDW: 14.8 % — ABNORMAL HIGH (ref 11.5–14.5)
WBC: 9.2 10*3/uL (ref 3.6–11.0)

## 2015-05-28 LAB — LACTIC ACID, PLASMA: Lactic Acid, Venous: 1.9 mmol/L (ref 0.5–2.0)

## 2015-05-28 LAB — BASIC METABOLIC PANEL
ANION GAP: 12 (ref 5–15)
BUN: 33 mg/dL — ABNORMAL HIGH (ref 6–20)
CALCIUM: 11.9 mg/dL — AB (ref 8.9–10.3)
CHLORIDE: 100 mmol/L — AB (ref 101–111)
CO2: 25 mmol/L (ref 22–32)
CREATININE: 3.38 mg/dL — AB (ref 0.44–1.00)
GFR calc Af Amer: 13 mL/min — ABNORMAL LOW (ref >60)
GFR calc non Af Amer: 11 mL/min — ABNORMAL LOW (ref >60)
GLUCOSE: 154 mg/dL — AB (ref 65–99)
Potassium: 4.1 mmol/L (ref 3.5–5.1)
Sodium: 137 mmol/L (ref 135–145)

## 2015-05-28 LAB — URINALYSIS COMPLETE WITH MICROSCOPIC (ARMC ONLY)
Bilirubin Urine: NEGATIVE
Glucose, UA: NEGATIVE mg/dL
Hgb urine dipstick: NEGATIVE
KETONES UR: NEGATIVE mg/dL
Leukocytes, UA: NEGATIVE
Nitrite: NEGATIVE
PH: 6 (ref 5.0–8.0)
PROTEIN: 100 mg/dL — AB
SQUAMOUS EPITHELIAL / LPF: NONE SEEN
Specific Gravity, Urine: 1.015 (ref 1.005–1.030)

## 2015-05-28 MED ORDER — SODIUM CHLORIDE 0.9 % IV BOLUS (SEPSIS)
500.0000 mL | Freq: Once | INTRAVENOUS | Status: AC
  Administered 2015-05-28: 500 mL via INTRAVENOUS

## 2015-05-28 MED ORDER — DIATRIZOATE MEGLUMINE & SODIUM 66-10 % PO SOLN
15.0000 mL | ORAL | Status: DC
Start: 2015-05-29 — End: 2015-05-29
  Administered 2015-05-28: 15 mL via ORAL
  Filled 2015-05-28: qty 30

## 2015-05-28 NOTE — ED Notes (Addendum)
Took pt off O2 per MD Manson PasseyBrown request. Pt O2 saturation currently 100% on RA. Will continue to monitor O2 sat

## 2015-05-28 NOTE — ED Notes (Signed)
MD Brown at bedside.

## 2015-05-28 NOTE — ED Notes (Signed)
Pt's daughter reports that pt told her yesterday her knee hurt. Pt's daughter reports that pt ambulate/change positions easily due to knee pain.

## 2015-05-28 NOTE — ED Notes (Signed)
Per EMS: Pt is weak as compared to her usual per [facility] nurses. Pt normally ambulates with walker. Pt currently needs help to sit up. Pt diaphoretic.   Per patient. I just have a sore neck.  Pt reports pain rated at 3 in her neck. Pt appears diaphoretic. Pt's O2 sats dropped to 80% during triage. Pt was placed on 2L . Pt O2 currently at 100%.

## 2015-05-28 NOTE — ED Provider Notes (Signed)
[facility] Emergency Department Provider Note  ____________________________________________  Time seen: 11:20 PM  I have reviewed the triage vital signs and the nursing notes.   HISTORY  Chief Complaint Weakness      HPI [name] is a 82 y.o. female presents with generalized weakness times one day per [facility] as well as the patient's daughter. Patient's daughter states that she is ambulatory at baseline however has had increased ability to ambulate today. Patient patient currently complaining of right knee discomfort which her daughter states she has been complaining of for the past day. Patient denies any fall no recent illness no cough no fever no vomiting or diarrhea.     Past Medical History  Diagnosis Date  . Old myocardial infarction   . Essential (primary) hypertension   . Hyperlipidemia   . Diabetes mellitus without complication [facility])     Patient Active Problem List   Diagnosis Date Noted  . Old myocardial infarction 08/29/2013  . Essential hypertension 08/29/2013  . Hyperlipidemia 08/29/2013    Past Surgical History  Procedure Laterality Date  . Appendectomy      Current Outpatient Rx  Name  Route  Sig  Dispense  Refill  . acitretin (SORIATANE) 25 MG capsule   Oral   Take 25 mg by mouth. 1 pill twice a week         . amLODipine (NORVASC) 5 MG tablet   Oral   Take 5 mg by mouth daily.         Marland Kitchen aspirin 81 MG tablet   Oral   Take 81 mg by mouth daily.         . calcium carbonate (OS-CAL) 600 MG TABS tablet   Oral   Take 600 mg by mouth 2 (two) times daily with a meal.         . Cholecalciferol (VITAMIN D) 2000 UNITS CAPS   Oral   Take 2,000 Units by mouth daily.         . cyanocobalamin 2000 MCG tablet   Oral   Take 2,000 mcg by mouth 2 (two) times daily.         Marland Kitchen donepezil (ARICEPT) 10 MG tablet   Oral   Take 10 mg by mouth at bedtime as needed.         . donepezil  (ARICEPT) 5 MG tablet               . fish oil-omega-3 fatty acids 1000 MG capsule   Oral   Take 1,000 mg by mouth daily. 2 capsules in morning and 2 in the evening         . HYDROcodone-acetaminophen (NORCO) 5-325 MG per tablet   Oral   Take 1 tablet by mouth every 6 (six) hours as needed for moderate pain.         . metoprolol (LOPRESSOR) 50 MG tablet   Oral   Take 50 mg by mouth 2 (two) times daily.         . mirtazapine (REMERON) 15 MG tablet               . omeprazole (PRILOSEC) 20 MG capsule               . PE-Shark Liver Oil-Cocoa Buttr (HEMORRHOID RE)   Rectal   Place 1 application rectally 4 (four) times daily as needed (hemorrhoid flare up).         . pioglitazone (ACTOS) 15 MG  tablet   Oral   Take 15 mg by mouth daily.         . triamcinolMarland Kitchenone cream (KENALOG) 0.1 %   Topical   Apply 1 application topically 2 (two) times daily.           Allergies Codeine and Sulfa antibiotics  History reviewed. No pertinent family history.  Social History Social History  Substance Use Topics  . Smoking status: Never Smoker   . Smokeless tobacco: Never Used  . Alcohol Use: No    Review of Systems  Constitutional: Negative for fever. Eyes: Negative for visual changes. ENT: Negative for sore throat. Cardiovascular: Negative for chest pain. Respiratory: Negative for shortness of breath. Gastrointestinal: Negative for abdominal pain, vomiting and diarrhea. Genitourinary: Negative for dysuria. Musculoskeletal: Negative for back pain. Skin: Negative for rash. Neurological: Negative for headaches, focal weakness or numbness.Positive generalized weakness   10-point ROS otherwise negative.  ____________________________________________   PHYSICAL EXAM:  VITAL SIGNS: ED Triage Vitals  Enc Vitals Group     BP 05/28/15 2231 168/76 mmHg     Pulse Rate 05/28/15 2231 80     Resp 05/28/15 2231 18     Temp 05/28/15 2231 97.9 F (36.6 C)     Temp  Source 05/28/15 2231 Oral     SpO2 05/28/15 2231 100 %     Weight 05/28/15 2231 120 lb (54.432 kg)     Height 05/28/15 2231 5\' 2"  (1.575 m)     Head Cir --      Peak Flow --      Pain Score 05/28/15 2232 3     Pain Loc --      Pain Edu? --      Excl. in GC? --      Constitutional: Alert and oriented. Well appearing and in no distress. Eyes: Conjunctivae are normal. PERRL. Normal extraocular movements. ENT   Head: Normocephalic and atraumatic.   Nose: No congestion/rhinnorhea.   Mouth/Throat: Mucous membranes are moist.   Neck: No stridor. Hematological/Lymphatic/Immunilogical: No cervical lymphadenopathy. Cardiovascular: Normal rate, regular rhythm. Normal and symmetric distal pulses are present in all extremities. No murmurs, rubs, or gallops. Respiratory: Normal respiratory effort without tachypnea nor retractions. Breath sounds are clear and equal bilaterally. No wheezes/rales/rhonchi. Gastrointestinal: Soft and nontender. No distention. There is no CVA tenderness. Genitourinary: deferred Musculoskeletal: Nontender with normal range of motion in all extremities. No joint effusions.  No lower extremity tenderness nor edema. Neurologic:  Normal speech and language. No gross focal neurologic deficits are appreciated. Speech is normal.  Skin:  Skin is warm, dry and intact. No rash noted. Psychiatric: Mood and affect are normal. Speech and behavior are normal. Patient exhibits appropriate insight and judgment.  ____________________________________________    LABS (pertinent positives/negatives)  Labs Reviewed  BASIC METABOLIC PANEL - Abnormal; Notable for the following:    Chloride 100 (*)    Glucose, Bld 154 (*)    BUN 33 (*)    Creatinine, Ser 3.38 (*)    Calcium 11.9 (*)    GFR calc non Af Amer 11 (*)    GFR calc Af Amer 13 (*)    All other components within normal limits  CBC - Abnormal; Notable for the following:    RDW 14.8 (*)    All other components  within normal limits  LACTIC ACID, PLASMA  LACTIC ACID, PLASMA  URINALYSIS COMPLETEWITH MICROSCOPIC ([facility] ONLY)     ____________________________________________   EKG  ED ECG REPORT I, [place] N  BROWN, the attending physician, personally viewed and interpreted this ECG.   Date: 05/28/2015  EKG Time: 10:22 PM  Rate: 78  Rhythm: Normal sinus rhythm  Axis: Normal  Intervals: Normal  ST&T Change: None   ____________________________________________    RADIOLOGY    CT Abdomen Pelvis Wo Contrast (Final result) Result time: 05/29/15 02:49:15   Final result by Rad Results In Interface (05/29/15 02:49:15)   Narrative:   CLINICAL DATA: Left-sided abdominal pain and right hip pain. Oral contrast only due to low GFR.  EXAM: CT ABDOMEN AND PELVIS WITHOUT CONTRAST  TECHNIQUE: Multidetector CT imaging of the abdomen and pelvis was performed following the standard protocol without IV contrast.  COMPARISON: 07/14/2010  FINDINGS: Dependent atelectasis in the lung bases. Coronary artery calcifications. Mild cardiac enlargement. Small esophageal hiatal hernia.  Cholelithiasis. Pneumobilia with gas seen in the common bile duct, intrahepatic bile ducts, and gallbladder lumen. Pneumobilia was present on the prior study. Changes likely reflect sequela of previous sphincterotomy although patient does not give a history of such surgery. Clinical correlation is recommended. There is a small amount of gas in the gallbladder wall. Emphysematous cholecystitis is not excluded. No bile duct dilatation.  The unenhanced appearance of the liver, spleen, pancreas, adrenal glands, inferior vena cava, and retroperitoneal lymph nodes is otherwise unremarkable. Kidneys are atrophic. No hydronephrosis. Calcification of abdominal aorta without aneurysm. Calcifications and mesenteric and celiac axis branch vessels.  Stomach, small bowel, and colon are not abnormally distended. Contrast  material flows through to the colon without evidence of bowel obstruction. No wall thickening is appreciated. There are scattered diverticula throughout the colon. No free air or free fluid in the abdomen.  Pelvis: Diverticulosis of the sigmoid colon without evidence of diverticulitis. Appendix is not identified. Bladder wall is not thickened. Uterus and ovaries are not enlarged. Visualization of the low pelvis is limited due to streak artifact from surgical hardware in the right hip and left hip prosthesis. Diffuse bone demineralization. Anterior compression of T11, unchanged since previous study. Degenerative changes in the spine and hips. No destructive bone lesions.  IMPRESSION: Pneumobilia again demonstrated, similar to previous study. Clinical correlation for history of sphincterotomy suggested. Cholelithiasis. Small amount of gas is seen in the gallbladder wall suggesting possibility of emphysematous cholecystitis.  No evidence of bowel obstruction or inflammation.   Electronically Signed By: Burman Nieves M.D. On: 05/29/2015 02:49          INITIAL IMPRESSION / ASSESSMENT AND PLAN / ED COURSE  Pertinent labs & imaging results that were available during my care of the patient were reviewed by me and considered in my medical decision making (see chart for details).  Patient and daughter chest affect that she does not want surgery performed. Patient discussed with Dr. Sheryle Hail for hospital admission for renal insufficiency and acute cholecystitis. IV Zosyn 3.375 mg given  ____________________________________________   FINAL CLINICAL IMPRESSION(S) / ED DIAGNOSES  Final diagnoses:  Renal insufficiency  Acute cholecystitis      Darci Current, MD 05/29/15 [phone]

## 2015-05-29 ENCOUNTER — Encounter: Payer: Self-pay | Admitting: Internal Medicine

## 2015-05-29 ENCOUNTER — Emergency Department: Payer: Medicare Other

## 2015-05-29 DIAGNOSIS — K810 Acute cholecystitis: Secondary | ICD-10-CM | POA: Diagnosis present

## 2015-05-29 DIAGNOSIS — Z8249 Family history of ischemic heart disease and other diseases of the circulatory system: Secondary | ICD-10-CM | POA: Diagnosis not present

## 2015-05-29 DIAGNOSIS — I2510 Atherosclerotic heart disease of native coronary artery without angina pectoris: Secondary | ICD-10-CM | POA: Diagnosis present

## 2015-05-29 DIAGNOSIS — Z66 Do not resuscitate: Secondary | ICD-10-CM | POA: Diagnosis present

## 2015-05-29 DIAGNOSIS — F0390 Unspecified dementia without behavioral disturbance: Secondary | ICD-10-CM | POA: Diagnosis present

## 2015-05-29 DIAGNOSIS — E785 Hyperlipidemia, unspecified: Secondary | ICD-10-CM | POA: Diagnosis present

## 2015-05-29 DIAGNOSIS — N183 Chronic kidney disease, stage 3 (moderate): Secondary | ICD-10-CM | POA: Diagnosis present

## 2015-05-29 DIAGNOSIS — K819 Cholecystitis, unspecified: Secondary | ICD-10-CM | POA: Diagnosis present

## 2015-05-29 DIAGNOSIS — E1122 Type 2 diabetes mellitus with diabetic chronic kidney disease: Secondary | ICD-10-CM | POA: Diagnosis present

## 2015-05-29 DIAGNOSIS — Z882 Allergy status to sulfonamides status: Secondary | ICD-10-CM | POA: Diagnosis not present

## 2015-05-29 DIAGNOSIS — L8990 Pressure ulcer of unspecified site, unspecified stage: Secondary | ICD-10-CM | POA: Insufficient documentation

## 2015-05-29 DIAGNOSIS — I129 Hypertensive chronic kidney disease with stage 1 through stage 4 chronic kidney disease, or unspecified chronic kidney disease: Secondary | ICD-10-CM | POA: Diagnosis present

## 2015-05-29 DIAGNOSIS — L89152 Pressure ulcer of sacral region, stage 2: Secondary | ICD-10-CM | POA: Diagnosis present

## 2015-05-29 DIAGNOSIS — Z7982 Long term (current) use of aspirin: Secondary | ICD-10-CM | POA: Diagnosis not present

## 2015-05-29 DIAGNOSIS — I252 Old myocardial infarction: Secondary | ICD-10-CM | POA: Diagnosis not present

## 2015-05-29 DIAGNOSIS — R531 Weakness: Secondary | ICD-10-CM | POA: Diagnosis present

## 2015-05-29 DIAGNOSIS — E860 Dehydration: Secondary | ICD-10-CM | POA: Diagnosis present

## 2015-05-29 DIAGNOSIS — Z885 Allergy status to narcotic agent status: Secondary | ICD-10-CM | POA: Diagnosis not present

## 2015-05-29 DIAGNOSIS — Z79899 Other long term (current) drug therapy: Secondary | ICD-10-CM | POA: Diagnosis not present

## 2015-05-29 DIAGNOSIS — N170 Acute kidney failure with tubular necrosis: Secondary | ICD-10-CM | POA: Diagnosis present

## 2015-05-29 LAB — MRSA PCR SCREENING: MRSA by PCR: NEGATIVE

## 2015-05-29 LAB — LACTIC ACID, PLASMA: LACTIC ACID, VENOUS: 1.8 mmol/L (ref 0.5–2.0)

## 2015-05-29 LAB — TSH: TSH: 6.43 u[IU]/mL — ABNORMAL HIGH (ref 0.350–4.500)

## 2015-05-29 LAB — HEMOGLOBIN A1C: Hgb A1c MFr Bld: 5.5 % (ref 4.0–6.0)

## 2015-05-29 MED ORDER — VITAMIN D 1000 UNITS PO TABS
2000.0000 [IU] | ORAL_TABLET | Freq: Every day | ORAL | Status: DC
  Administered 2015-05-29 – 2015-05-31 (×3): 2000 [IU] via ORAL
  Filled 2015-05-29 (×3): qty 2

## 2015-05-29 MED ORDER — HEPARIN SODIUM (PORCINE) 5000 UNIT/ML IJ SOLN
5000.0000 [IU] | Freq: Three times a day (TID) | INTRAMUSCULAR | Status: DC
  Administered 2015-05-29 – 2015-05-31 (×6): 5000 [IU] via SUBCUTANEOUS
  Filled 2015-05-29 (×7): qty 1

## 2015-05-29 MED ORDER — MIRTAZAPINE 15 MG PO TABS
15.0000 mg | ORAL_TABLET | Freq: Every day | ORAL | Status: DC
  Administered 2015-05-29 – 2015-05-30 (×2): 15 mg via ORAL
  Filled 2015-05-29 (×2): qty 1

## 2015-05-29 MED ORDER — MORPHINE SULFATE (PF) 2 MG/ML IV SOLN: 1.0000 mg | INTRAVENOUS | Status: DC | PRN

## 2015-05-29 MED ORDER — HYDROCODONE-ACETAMINOPHEN 5-325 MG PO TABS: 1.0000 | ORAL_TABLET | Freq: Three times a day (TID) | ORAL | Status: DC | PRN

## 2015-05-29 MED ORDER — PIPERACILLIN-TAZOBACTAM 3.375 G IVPB
3.3750 g | Freq: Two times a day (BID) | INTRAVENOUS | Status: DC
  Administered 2015-05-29 – 2015-05-31 (×4): 3.375 g via INTRAVENOUS
  Filled 2015-05-29 (×5): qty 50

## 2015-05-29 MED ORDER — CALCIUM CARBONATE ANTACID 500 MG PO CHEW
1.0000 | CHEWABLE_TABLET | Freq: Two times a day (BID) | ORAL | Status: DC
  Administered 2015-05-29 – 2015-05-31 (×5): 200 mg via ORAL
  Filled 2015-05-29 (×5): qty 1

## 2015-05-29 MED ORDER — TRIAMCINOLONE ACETONIDE 0.1 % EX CREA
1.0000 "application " | TOPICAL_CREAM | Freq: Two times a day (BID) | CUTANEOUS | Status: DC
  Administered 2015-05-29 – 2015-05-31 (×5): 1 via TOPICAL
  Filled 2015-05-29: qty 15

## 2015-05-29 MED ORDER — AMLODIPINE BESYLATE 5 MG PO TABS
5.0000 mg | ORAL_TABLET | Freq: Every day | ORAL | Status: DC
  Administered 2015-05-29 – 2015-05-31 (×3): 5 mg via ORAL
  Filled 2015-05-29 (×4): qty 1

## 2015-05-29 MED ORDER — CALCIUM CARBONATE 600 MG PO TABS: 600.0000 mg | ORAL_TABLET | Freq: Two times a day (BID) | ORAL | Status: DC

## 2015-05-29 MED ORDER — ONDANSETRON HCL 4 MG/2ML IJ SOLN: 4.0000 mg | Freq: Four times a day (QID) | INTRAMUSCULAR | Status: DC | PRN

## 2015-05-29 MED ORDER — DONEPEZIL HCL 5 MG PO TABS
5.0000 mg | ORAL_TABLET | Freq: Every day | ORAL | Status: DC
  Administered 2015-05-30: 21:00:00 5 mg via ORAL
  Filled 2015-05-29: qty 1

## 2015-05-29 MED ORDER — PHENYLEPHRINE IN HARD FAT 0.25 % RE SUPP: 1.0000 | Freq: Two times a day (BID) | RECTAL | Status: DC | PRN

## 2015-05-29 MED ORDER — ACETAMINOPHEN 325 MG PO TABS: 650.0000 mg | ORAL_TABLET | Freq: Four times a day (QID) | ORAL | Status: DC | PRN

## 2015-05-29 MED ORDER — PE-SHARK LIVER OIL-COCOA BUTTR 0.25-3-85.5 % RE SUPP: 1.0000 | Freq: Four times a day (QID) | RECTAL | Status: DC | PRN

## 2015-05-29 MED ORDER — FAMOTIDINE 20 MG PO TABS
20.0000 mg | ORAL_TABLET | Freq: Every day | ORAL | Status: DC
  Administered 2015-05-29 – 2015-05-31 (×3): 20 mg via ORAL
  Filled 2015-05-29 (×3): qty 1

## 2015-05-29 MED ORDER — ACETAMINOPHEN 650 MG RE SUPP: 650.0000 mg | Freq: Four times a day (QID) | RECTAL | Status: DC | PRN

## 2015-05-29 MED ORDER — ONDANSETRON HCL 4 MG PO TABS: 4.0000 mg | ORAL_TABLET | Freq: Four times a day (QID) | ORAL | Status: DC | PRN

## 2015-05-29 MED ORDER — VITAMIN B-12 1000 MCG PO TABS
2000.0000 ug | ORAL_TABLET | Freq: Every day | ORAL | Status: DC
  Administered 2015-05-29 – 2015-05-31 (×3): 2000 ug via ORAL
  Filled 2015-05-29 (×2): qty 2
  Filled 2015-05-29: qty 1
  Filled 2015-05-29: qty 2

## 2015-05-29 MED ORDER — SODIUM CHLORIDE 0.9 % IV BOLUS (SEPSIS)
500.0000 mL | Freq: Once | INTRAVENOUS | Status: AC
  Administered 2015-05-29: 500 mL via INTRAVENOUS

## 2015-05-29 MED ORDER — ACITRETIN 25 MG PO CAPS: 25.0000 mg | ORAL_CAPSULE | ORAL | Status: DC

## 2015-05-29 MED ORDER — SODIUM CHLORIDE 0.9 % IV SOLN
INTRAVENOUS | Status: DC
  Administered 2015-05-29 (×2): via INTRAVENOUS

## 2015-05-29 MED ORDER — METOPROLOL TARTRATE 50 MG PO TABS
50.0000 mg | ORAL_TABLET | Freq: Every day | ORAL | Status: DC
  Administered 2015-05-29: 50 mg via ORAL
  Filled 2015-05-29 (×2): qty 1

## 2015-05-29 MED ORDER — DOCUSATE SODIUM 100 MG PO CAPS
100.0000 mg | ORAL_CAPSULE | Freq: Two times a day (BID) | ORAL | Status: DC
  Administered 2015-05-29 – 2015-05-30 (×4): 100 mg via ORAL
  Filled 2015-05-29 (×4): qty 1

## 2015-05-29 MED ORDER — PIPERACILLIN-TAZOBACTAM 3.375 G IVPB
3.3750 g | Freq: Once | INTRAVENOUS | Status: AC
  Administered 2015-05-29: 3.375 g via INTRAVENOUS
  Filled 2015-05-29: qty 50

## 2015-05-29 MED ORDER — ACITRETIN 25 MG PO CAPS
25.0000 mg | ORAL_CAPSULE | ORAL | Status: DC
  Administered 2015-05-29: 15:00:00 25 mg via ORAL

## 2015-05-29 NOTE — Clinical Social Work Note (Signed)
Clinical Social Work Assessment  Patient Details  Name: [name] MRN: [id number] Date of Birth: [birth year]  Date of referral:  05/29/15               Reason for consult:  Facility Placement                Permission sought to share information with:  Family Supports Permission granted to share information::  Yes, Verbal Permission Granted  Name::     Daughter, [name]   Housing/Transportation Living arrangements for the past 2 months:  Assisted Living Facility Source of Information:  Patient Patient Interpreter Needed:  None Criminal Activity/Legal Involvement Pertinent to Current Situation/Hospitalization:  No - Comment as needed Significant Relationships:  Adult Children Lives with:  Facility Resident Do you feel safe going back to the place where you live?  Yes Need for family participation in patient care:  Yes (Comment)  Care giving concerns:  No care giving concerns identified.   Social Worker assessment / plan:  CSW spoke with pt's daughter to address consult as pt has dementia. CSW introduced herself and explained role of social work. CSW also explained process of returning to ALF. Pt's daughter is able to provide transportation to facility at discharge. Per MD, discharge will likely be tomorrow. Pt's daughter is aware and agreeable to discharge plan. CSW also spoke with [facility] and pt is able to return at discharge. CSW will continue to follow.   Employment status:  Retired Health and safety inspectornsurance information:  Armed forces operational officerMedicare, Medicaid In [place] Recommendations:  Not assessed at this time Information / Referral to community resources:  Other (Comment Required) ([facility])  Patient/Family's Response to care:  Pt's daughter was appreciative of CSW support.   Patient/Family's Understanding of and Emotional Response to Diagnosis, Current Treatment, and Prognosis:  Pt would like for pt to return to facility at discharge.   Emotional  Assessment Appearance:  Appears stated age Attitude/Demeanor/Rapport:  Other (Appropriate) Affect (typically observed):  Pleasant Orientation:  Oriented to Self Alcohol / Substance use:  Never Used Psych involvement (Current and /or in the community):  No (Comment)  Discharge Needs  Concerns to be addressed:  Adjustment to Illness Readmission within the last 30 days:  No Current discharge risk:  Chronically ill Barriers to Discharge:  Continued Medical Work up   [employer] RxSarah Oleva Koo, LCSW 05/29/2015, 4:01 PM

## 2015-05-29 NOTE — ED Notes (Signed)
Called CT to let them know pt done with contrast

## 2015-05-29 NOTE — Progress Notes (Signed)
Pharmacy Antibiotic Note  [name] is a 82 y.o. female admitted on 05/28/2015 with cholecystitis.  Pharmacy has been consulted for Zosyn dosing.  Plan: Zosyn 3.375 grams q 12 hours ordered  Height: 5\' 2"  (157.5 cm) Weight: 120 lb (54.432 kg) IBW/kg (Calculated) : 50.1  Temp (24hrs), Avg:98.4 F (36.9 C), Min:97.9 F (36.6 C), Max:98.9 F (37.2 C)   Recent Labs Lab 05/28/15 2223 05/29/15 0124  WBC 9.2  --   CREATININE 3.38*  --   LATICACIDVEN 1.9 1.8    Estimated Creatinine Clearance: 8.4 mL/min (by C-G formula based on Cr of 3.38).    Allergies  Allergen Reactions  . Codeine Other (See Comments)    Reaction: unknown  . Sulfa Antibiotics Hives    Antimicrobials this admission: zosyn  >>    >>   Dose adjustments this admission:   Microbiology results: 4/21 BCx: pending   Thank you for allowing pharmacy to be a part of this patient's care.  Dylyn Mclaren S 05/29/2015 6:29 AM

## 2015-05-29 NOTE — Progress Notes (Signed)
[facility] [place] at [facility]   PATIENT NAME: [name]    MR#:  [id number]  DATE OF BIRTH:  [birth year]  SUBJECTIVE:  CHIEF COMPLAINT:   Chief Complaint  Patient presents with  . Weakness   No concerns from the patient. Poor historian.   REVIEW OF SYSTEMS:    Review of Systems  Unable to perform ROS: dementia    DRUG ALLERGIES:   Allergies  Allergen Reactions  . Codeine Other (See Comments)    Reaction: unknown  . Sulfa Antibiotics Hives    VITALS:  Blood pressure 166/69, pulse 57, temperature 98.1 F (36.7 C), temperature source Oral, resp. rate 18, height 5\' 2"  (1.575 m), weight 54.432 kg (120 lb), SpO2 96 %.  PHYSICAL EXAMINATION:   Physical Exam  GENERAL:  82 y.o.-year-old patient lying in the bed with no acute distress.  EYES: Pupils equal, round, reactive to light and accommodation. No scleral icterus. Extraocular muscles intact.  HEENT: Head atraumatic, normocephalic. Oropharynx and nasopharynx clear.  NECK:  Supple, no jugular venous distention. No thyroid enlargement, no tenderness.  LUNGS: Normal breath sounds bilaterally, no wheezing, rales, rhonchi. No use of accessory muscles of respiration.  CARDIOVASCULAR: S1, S2 normal. No murmurs, rubs, or gallops.  ABDOMEN: Soft, nondistended. Bowel sounds present. No organomegaly or mass. Right upper quadrant tenderness EXTREMITIES: No cyanosis, clubbing or edema b/l.    NEUROLOGIC: Cranial nerves II through XII are intact. No focal Motor or sensory deficits b/l.   PSYCHIATRIC: The patient is alert and awake. SKIN: No obvious rash, lesion, or ulcer.   LABORATORY PANEL:   CBC  Recent Labs Lab 05/28/15 2223  WBC 9.2  HGB 12.7  HCT 37.6  PLT 197   ------------------------------------------------------------------------------------------------------------------ Chemistries   Recent Labs Lab 05/28/15 2223  NA 137  K 4.1  CL 100*  CO2 25  GLUCOSE 154*  BUN 33*   CREATININE 3.38*  CALCIUM 11.9*   ------------------------------------------------------------------------------------------------------------------  Cardiac Enzymes No results for input(s): TROPONINI in the last 168 hours. ------------------------------------------------------------------------------------------------------------------  RADIOLOGY:  Ct Abdomen Pelvis Wo Contrast  05/29/2015  CLINICAL DATA:  Left-sided abdominal pain and right hip pain. Oral contrast only due to low GFR. EXAM: CT ABDOMEN AND PELVIS WITHOUT CONTRAST TECHNIQUE: Multidetector CT imaging of the abdomen and pelvis was performed following the standard protocol without IV contrast. COMPARISON:  07/14/2010 FINDINGS: Dependent atelectasis in the lung bases. Coronary artery calcifications. Mild cardiac enlargement. Small esophageal hiatal hernia. Cholelithiasis. Pneumobilia with gas seen in the common bile duct, intrahepatic bile ducts, and gallbladder lumen. Pneumobilia was present on the prior study. Changes likely reflect sequela of previous sphincterotomy although patient does not give a history of such surgery. Clinical correlation is recommended. There is a small amount of gas in the gallbladder wall. Emphysematous cholecystitis is not excluded. No bile duct dilatation. The unenhanced appearance of the liver, spleen, pancreas, adrenal glands, inferior vena cava, and retroperitoneal lymph nodes is otherwise unremarkable. Kidneys are atrophic. No hydronephrosis. Calcification of abdominal aorta without aneurysm. Calcifications and mesenteric and celiac axis branch vessels. Stomach, small bowel, and colon are not abnormally distended. Contrast material flows through to the colon without evidence of bowel obstruction. No wall thickening is appreciated. There are scattered diverticula throughout the colon. No free air or free fluid in the abdomen. Pelvis: Diverticulosis of the sigmoid colon without evidence of diverticulitis.  Appendix is not identified. Bladder wall is not thickened. Uterus and ovaries are not enlarged. Visualization of the low pelvis is  limited due to streak artifact from surgical hardware in the right hip and left hip prosthesis. Diffuse bone demineralization. Anterior compression of T11, unchanged since previous study. Degenerative changes in the spine and hips. No destructive bone lesions. IMPRESSION: Pneumobilia again demonstrated, similar to previous study. Clinical correlation for history of sphincterotomy suggested. Cholelithiasis. Small amount of gas is seen in the gallbladder wall suggesting possibility of emphysematous cholecystitis. No evidence of bowel obstruction or inflammation. Electronically Signed   By: Burman Nieves M.D.   On: 05/29/2015 02:49     ASSESSMENT AND PLAN:   * Acute cholecystitis On IV antibiotics. Full liquid diet. No surgery as per request from patient and family.  * Acute renal failure over CKD stage III Due to poor oral intake and infection. On IV fluids. Monitor input and output. Slowly improving.  * Hypertension Home medications  * Dementia Monitor for inpatient delirium.  * DVT prophylaxis with heparin  All the records are reviewed and case discussed with Care Management/Social Workerr. Management plans discussed with the patient, family and they are in agreement.  CODE STATUS: DNR  DVT Prophylaxis: SCDs  TOTAL TIME TAKING CARE OF THIS PATIENT: 25 minutes.   POSSIBLE D/C IN 1-2 DAYS, DEPENDING ON CLINICAL CONDITION.  Milagros Loll R M.D on 05/29/2015 at 2:25 PM  Between 7am to 6pm - Pager - [phone]  After 6pm go to [email or web address] - password [email or web address]  [facility]  Office  [phone]  CC: Primary care physician; Ginette Otto, MD  Note: This dictation was prepared with Dragon dictation along with smaller phrase technology. Any transcriptional errors that result from this process are unintentional.

## 2015-05-29 NOTE — ED Notes (Signed)
Called floor to let them know pt on the way up 

## 2015-05-29 NOTE — H&P (Signed)
[name] is an 82 y.o. female.   Chief Complaint: Generalized weakness HPI: The patient presents to the emergency department from [facility] complaining of generalized weakness. Nursing home staff noticed the patient was having difficulty ambulating even with her walker. The patient's daughter corroborates this report as the patient had told her earlier in the day that she was having some leg or knee pain after which she seemed to be exhausted. Nursing home staff reported that she was unable to sit up in bed and sent her to the emergency department for evaluation. CT scan of abdomen revealed cholecystitis with pneumobilia of the common bile duct. The emergency department started the patient on antibiotics. Laboratory evaluation also revealed acute on chronic kidney injury which prompted the emergency department staff to call for admission.  Past Medical History  Diagnosis Date  . Old myocardial infarction   . Essential (primary) hypertension   . Hyperlipidemia   . Diabetes mellitus without complication [facility])     Past Surgical History  Procedure Laterality Date  . Appendectomy      Family History  Problem Relation Age of Onset  . CAD Father    Social History:  reports that she has never smoked. She has never used smokeless tobacco. She reports that she does not drink alcohol or use illicit drugs.  Allergies:  Allergies  Allergen Reactions  . Codeine Other (See Comments)    Reaction: unknown  . Sulfa Antibiotics Hives    Prior to Admission medications   Medication Sig Start Date End Date Taking? Authorizing Provider  acitretin (SORIATANE) 25 MG capsule Take 25 mg by mouth See admin instructions. Takes 14m five times weekly (Monday-Friday)   Yes Historical Provider, MD  amLODipine (NORVASC) 5 MG tablet Take 5 mg by mouth daily.   Yes Historical Provider, MD  calcium carbonate (OS-CAL) 600 MG TABS tablet Take 600 mg by mouth 2 (two) times daily with a meal.   Yes  Historical Provider, MD  Cholecalciferol (VITAMIN D) 2000 UNITS CAPS Take 2,000 Units by mouth daily.   Yes Historical Provider, MD  donepezil (ARICEPT) 5 MG tablet Take 5 mg by mouth at bedtime.  09/27/14  Yes Historical Provider, MD  HYDROcodone-acetaminophen (NORCO) 5-325 MG per tablet Take 1 tablet by mouth every 8 (eight) hours as needed for moderate pain.    Yes Historical Provider, MD  metoprolol (LOPRESSOR) 50 MG tablet Take 50 mg by mouth daily.    Yes Historical Provider, MD  mirtazapine (REMERON) 15 MG tablet Take 15 mg by mouth at bedtime.  09/27/14  Yes Historical Provider, MD  PE-Shark Liver Oil-Cocoa Buttr (HEMORRHOID RE) Place 1 application rectally 4 (four) times daily as needed (hemorrhoid flare up).   Yes Historical Provider, MD  ranitidine (ZANTAC) 150 MG tablet Take 150 mg by mouth at bedtime.   Yes Historical Provider, MD  triamcinolone cream (KENALOG) 0.1 % Apply 1 application topically 2 (two) times daily.   Yes Historical Provider, MD  vitamin B-12 (CYANOCOBALAMIN) 1000 MCG tablet Take 2,000 mcg by mouth daily.   Yes Historical Provider, MD      Results for orders placed or performed during the hospital encounter of 05/28/15 (from the past 48 hour(s))  Lactic acid, plasma     Status: None   Collection Time: 05/28/15 10:23 PM  Result Value Ref Range   Lactic Acid, Venous 1.9 0.5 - 2.0 mmol/L  Basic metabolic panel     Status: Abnormal   Collection Time: 05/28/15  10:23 PM  Result Value Ref Range   Sodium 137 135 - 145 mmol/L   Potassium 4.1 3.5 - 5.1 mmol/L   Chloride 100 (L) 101 - 111 mmol/L   CO2 25 22 - 32 mmol/L   Glucose, Bld 154 (H) 65 - 99 mg/dL   BUN 33 (H) 6 - 20 mg/dL   Creatinine, Ser 3.38 (H) 0.44 - 1.00 mg/dL   Calcium 11.9 (H) 8.9 - 10.3 mg/dL   GFR calc non Af Amer 11 (L) >60 mL/min   GFR calc Af Amer 13 (L) >60 mL/min    Comment: (NOTE) The eGFR has been calculated using the CKD EPI equation. This calculation has not been validated in all  clinical situations. eGFR's persistently <60 mL/min signify possible Chronic Kidney Disease.    Anion gap 12 5 - 15  CBC     Status: Abnormal   Collection Time: 05/28/15 10:23 PM  Result Value Ref Range   WBC 9.2 3.6 - 11.0 K/uL   RBC 4.37 3.80 - 5.20 MIL/uL   Hemoglobin 12.7 12.0 - 16.0 g/dL   HCT 37.6 35.0 - 47.0 %   MCV 86.1 80.0 - 100.0 fL   MCH 29.0 26.0 - 34.0 pg   MCHC 33.7 32.0 - 36.0 g/dL   RDW 14.8 (H) 11.5 - 14.5 %   Platelets 197 150 - 440 K/uL  Urinalysis complete, with microscopic ([facility] only)     Status: Abnormal   Collection Time: 05/28/15 11:08 PM  Result Value Ref Range   Color, Urine YELLOW (A) YELLOW   APPearance CLEAR (A) CLEAR   Glucose, UA NEGATIVE NEGATIVE mg/dL   Bilirubin Urine NEGATIVE NEGATIVE   Ketones, ur NEGATIVE NEGATIVE mg/dL   Specific Gravity, Urine 1.015 1.005 - 1.030   Hgb urine dipstick NEGATIVE NEGATIVE   pH 6.0 5.0 - 8.0   Protein, ur 100 (A) NEGATIVE mg/dL   Nitrite NEGATIVE NEGATIVE   Leukocytes, UA NEGATIVE NEGATIVE   RBC / HPF 0-5 0 - 5 RBC/hpf   WBC, UA 0-5 0 - 5 WBC/hpf   Bacteria, UA RARE (A) NONE SEEN   Squamous Epithelial / LPF NONE SEEN NONE SEEN   Mucous PRESENT   Lactic acid, plasma     Status: None   Collection Time: 05/29/15  1:24 AM  Result Value Ref Range   Lactic Acid, Venous 1.8 0.5 - 2.0 mmol/L   Ct Abdomen Pelvis Wo Contrast  05/29/2015  CLINICAL DATA:  Left-sided abdominal pain and right hip pain. Oral contrast only due to low GFR. EXAM: CT ABDOMEN AND PELVIS WITHOUT CONTRAST TECHNIQUE: Multidetector CT imaging of the abdomen and pelvis was performed following the standard protocol without IV contrast. COMPARISON:  07/14/2010 FINDINGS: Dependent atelectasis in the lung bases. Coronary artery calcifications. Mild cardiac enlargement. Small esophageal hiatal hernia. Cholelithiasis. Pneumobilia with gas seen in the common bile duct, intrahepatic bile ducts, and gallbladder lumen. Pneumobilia was present on the prior  study. Changes likely reflect sequela of previous sphincterotomy although patient does not give a history of such surgery. Clinical correlation is recommended. There is a small amount of gas in the gallbladder wall. Emphysematous cholecystitis is not excluded. No bile duct dilatation. The unenhanced appearance of the liver, spleen, pancreas, adrenal glands, inferior vena cava, and retroperitoneal lymph nodes is otherwise unremarkable. Kidneys are atrophic. No hydronephrosis. Calcification of abdominal aorta without aneurysm. Calcifications and mesenteric and celiac axis branch vessels. Stomach, small bowel, and colon are not abnormally distended. Contrast material flows through  to the colon without evidence of bowel obstruction. No wall thickening is appreciated. There are scattered diverticula throughout the colon. No free air or free fluid in the abdomen. Pelvis: Diverticulosis of the sigmoid colon without evidence of diverticulitis. Appendix is not identified. Bladder wall is not thickened. Uterus and ovaries are not enlarged. Visualization of the low pelvis is limited due to streak artifact from surgical hardware in the right hip and left hip prosthesis. Diffuse bone demineralization. Anterior compression of T11, unchanged since previous study. Degenerative changes in the spine and hips. No destructive bone lesions. IMPRESSION: Pneumobilia again demonstrated, similar to previous study. Clinical correlation for history of sphincterotomy suggested. Cholelithiasis. Small amount of gas is seen in the gallbladder wall suggesting possibility of emphysematous cholecystitis. No evidence of bowel obstruction or inflammation. Electronically Signed   By: Lucienne Capers M.D.   On: 05/29/2015 02:49    Review of Systems  Constitutional: Negative for fever and chills.  HENT: Negative for sore throat and tinnitus.   Eyes: Negative for blurred vision and redness.  Respiratory: Negative for cough and shortness of breath.    Cardiovascular: Negative for chest pain, palpitations, orthopnea and PND.  Gastrointestinal: Negative for nausea, vomiting, abdominal pain and diarrhea.  Genitourinary: Negative for dysuria, urgency and frequency.  Musculoskeletal: Negative for myalgias and joint pain.  Skin: Negative for rash.       No lesions  Neurological: Positive for weakness. Negative for speech change and focal weakness.  Endo/Heme/Allergies: Does not bruise/bleed easily.       No temperature intolerance  Psychiatric/Behavioral: Negative for depression and suicidal ideas.    Blood pressure 155/74, pulse 78, temperature 97.9 F (36.6 C), temperature source Oral, resp. rate 18, height _0  (1.575 m), weight 54.432 kg (120 lb), SpO2 94 %. Physical Exam  Vitals reviewed. Constitutional: She is oriented to person, place, and time. She appears well-developed and well-nourished. No distress.  HENT:  Head: Normocephalic and atraumatic.  Mouth/Throat: Oropharynx is clear and moist.  Eyes: Conjunctivae and EOM are normal. Pupils are equal, round, and reactive to light. No scleral icterus.  Neck: Normal range of motion. Neck supple. No JVD present. No tracheal deviation present. No thyromegaly present.  Cardiovascular: Normal rate, regular rhythm and normal heart sounds.  Exam reveals no gallop and no friction rub.   No murmur heard. Respiratory: Effort normal and breath sounds normal.  GI: Soft. Bowel sounds are normal. She exhibits no distension. There is no tenderness.  Genitourinary:  Deferred  Musculoskeletal: Normal range of motion. She exhibits no edema.  Lymphadenopathy:    She has no cervical adenopathy.  Neurological: She is alert and oriented to person, place, and time. No cranial nerve deficit. She exhibits normal muscle tone.  Skin: Skin is warm and dry. No rash noted. No erythema.  Psychiatric: She has a normal mood and affect. Her behavior is normal. Judgment and thought content normal.      Assessment/Plan This is a 82 year old female admitted for cholecystitis and acute kidney injury. 1. Cholecystitis: The patient does not want to have surgery. We will continue conservative management with antibiotics. I place patient on a clear liquid diet which we may advance as tolerated. 2. Acute on chronic kidney injury: Secondary to dehydration. I suspect the patient has not been eating well due to her cholecystitis. We will rehydrate with intravenous fluid. Encourage by mouth intake if the patient can tolerate. 3. Essential hypertension: Continue metoprolol and amlodipine 4. Dementia: Continue Aricept and Remeron 5. DVT  prophylaxis: Heparin 6. GI prophylaxis: Continue PPI per home regimen The patient is a DO NOT RESUSCITATE. Time spent on admission orders and patient care approximately 45 minutes  Harrie Foreman, MD 05/29/2015, 4:43 AM

## 2015-05-29 NOTE — ED Notes (Signed)
MD Brown at bedside.

## 2015-05-29 NOTE — ED Notes (Signed)
Pt had episode of urinary incontinence. Changed pt's incontinence brief

## 2015-05-30 LAB — CBC WITH DIFFERENTIAL/PLATELET
BASOS ABS: 0.1 10*3/uL (ref 0–0.1)
BASOS PCT: 1 %
Eosinophils Absolute: 0.3 10*3/uL (ref 0–0.7)
Eosinophils Relative: 6 %
HEMATOCRIT: 36.8 % (ref 35.0–47.0)
Hemoglobin: 12.3 g/dL (ref 12.0–16.0)
Lymphocytes Relative: 27 %
Lymphs Abs: 1.5 10*3/uL (ref 1.0–3.6)
MCH: 28.4 pg (ref 26.0–34.0)
MCHC: 33.3 g/dL (ref 32.0–36.0)
MCV: 85.2 fL (ref 80.0–100.0)
MONO ABS: 0.5 10*3/uL (ref 0.2–0.9)
Monocytes Relative: 9 %
NEUTROS ABS: 3.2 10*3/uL (ref 1.4–6.5)
Neutrophils Relative %: 57 %
PLATELETS: 178 10*3/uL (ref 150–440)
RBC: 4.33 MIL/uL (ref 3.80–5.20)
RDW: 14.8 % — AB (ref 11.5–14.5)
WBC: 5.7 10*3/uL (ref 3.6–11.0)

## 2015-05-30 LAB — COMPREHENSIVE METABOLIC PANEL
ALBUMIN: 3.1 g/dL — AB (ref 3.5–5.0)
ALT: 9 U/L — ABNORMAL LOW (ref 14–54)
AST: 16 U/L (ref 15–41)
Alkaline Phosphatase: 41 U/L (ref 38–126)
Anion gap: 7 (ref 5–15)
BILIRUBIN TOTAL: 0.5 mg/dL (ref 0.3–1.2)
BUN: 23 mg/dL — AB (ref 6–20)
CHLORIDE: 106 mmol/L (ref 101–111)
CO2: 27 mmol/L (ref 22–32)
Calcium: 9.8 mg/dL (ref 8.9–10.3)
Creatinine, Ser: 2.76 mg/dL — ABNORMAL HIGH (ref 0.44–1.00)
GFR calc Af Amer: 16 mL/min — ABNORMAL LOW (ref >60)
GFR calc non Af Amer: 14 mL/min — ABNORMAL LOW (ref >60)
GLUCOSE: 82 mg/dL (ref 65–99)
POTASSIUM: 3.6 mmol/L (ref 3.5–5.1)
Sodium: 140 mmol/L (ref 135–145)
Total Protein: 6.1 g/dL — ABNORMAL LOW (ref 6.5–8.1)

## 2015-05-30 MED ORDER — METOPROLOL TARTRATE 50 MG PO TABS
50.0000 mg | ORAL_TABLET | Freq: Two times a day (BID) | ORAL | Status: DC
  Administered 2015-05-31: 06:00:00 50 mg via ORAL
  Filled 2015-05-30 (×2): qty 1

## 2015-05-30 MED ORDER — ISOSORBIDE MONONITRATE ER 60 MG PO TB24
30.0000 mg | ORAL_TABLET | Freq: Every day | ORAL | Status: DC
  Administered 2015-05-30 – 2015-05-31 (×2): 30 mg via ORAL
  Filled 2015-05-30 (×2): qty 1

## 2015-05-30 NOTE — Evaluation (Signed)
Physical Therapy Evaluation Patient Details Name: [name] MRN: [id number] DOB: [birth year] Today's Date: 05/30/2015   History of Present Illness  Pt here with abdominal pain and weakness.  She has baseline dementia and is at the [facility] care home.   Clinical Impression  Pt is slow and labored with PT exam and becomes fatigued with limited ambulation but ultimately she appears safe to return back to her assisted living care home, but will still need some PT intervention. She shows good effort t/o the session and reports no pain, though she is somewhat lethargic.    Follow Up Recommendations Home health PT    Equipment Recommendations       Recommendations for Other Services       Precautions / Restrictions Precautions Precautions: Fall Restrictions Weight Bearing Restrictions: No      Mobility  Bed Mobility Overal bed mobility: Modified Independent             General bed mobility comments: Pt slow with transition and needed heavy UE assist but was able to get to EOB w/o direct assist  Transfers Overall transfer level: Modified independent Equipment used: Rolling walker (2 wheeled)             General transfer comment: Pt is able to get to standing with light use of the walker.  Again pt slow with transition, but ultimately safe and able to rise w/o direct assist.  Ambulation/Gait Ambulation/Gait assistance: Min assist Ambulation Distance (Feet): 35 Feet Assistive device: Rolling walker (2 wheeled)       General Gait Details: Pt is very slow and labored with ambulation and becomes fatigued much quicker than she had expected.  She does not have any LOBs but is slow and shuffling with cadence.    Stairs            Wheelchair Mobility    Modified Rankin (Stroke Patients Only)       Balance                                             Pertinent Vitals/Pain      Home Living Family/patient expects to be discharged to::  Assisted living               Home Equipment: Walker - 4 wheels      Prior Function Level of Independence: Needs assistance;Independent with assistive device(s)         Comments: Pt would occasionally need assist for dressing/toileting but typically was able to do most of what she needed w/o too much intervention     Hand Dominance        Extremity/Trunk Assessment   Upper Extremity Assessment: Generalized weakness (pt does have age appropriate ROM)           Lower Extremity Assessment: Generalized weakness (age appropriate limitations)         Communication   Communication:  (slow to respond, mild confusion)  Cognition Arousal/Alertness: Awake/alert Behavior During Therapy: WFL for tasks assessed/performed Overall Cognitive Status: History of cognitive impairments - at baseline                      General Comments      Exercises        Assessment/Plan    PT Assessment Patient needs continued PT services  PT Diagnosis Difficulty walking;Generalized weakness  PT Problem List Decreased strength;Decreased range of motion;Decreased activity tolerance;Decreased balance;Decreased mobility;Decreased coordination;Decreased safety awareness;Decreased knowledge of use of DME  PT Treatment Interventions DME instruction;Functional mobility training;Gait training;Therapeutic activities;Therapeutic exercise;Balance training;Patient/family education   PT Goals (Current goals can be found in the Care Plan section) Acute Rehab PT Goals Patient Stated Goal: family would rather her go back to familiar [facility] PT Goal Formulation: With patient/family Time For Goal Achievement: 06/13/15 Potential to Achieve Goals: Fair    Frequency Min 2X/week   Barriers to discharge        Co-evaluation               End of Session Equipment Utilized During Treatment: Gait belt Activity Tolerance: Patient limited by fatigue Patient left: with chair alarm  set;with call bell/phone within reach;with family/visitor present           Time: [id number] PT Time Calculation (min) (ACUTE ONLY): 24 min   Charges:   PT Evaluation $PT Eval Low Complexity: 1 Procedure     PT G Codes:       Loran SentersGalen Lenee Franze, PT, DPT [phone]  Malachi ProGalen R Trayson Stitely 05/30/2015, 4:42 PM

## 2015-05-30 NOTE — Progress Notes (Signed)
[facility] [place] [facility]   PATIENT NAME: [name]    MR#:  [id number]  DATE OF BIRTH:  06-Dec-1921  SUBJECTIVE:  CHIEF COMPLAINT:   Chief Complaint  Patient presents with  . Weakness   - admitted for abdominal pain and generalized weakness. CT abd with emphysematous cholecystitis - Patient and family refused any surgical intervention, currently on ABX  - improving symptoms. Tolerating liquid diet- poor po intake  REVIEW OF SYSTEMS:  Review of Systems  Constitutional: Positive for malaise/fatigue. Negative for fever and chills.  HENT: Negative for ear discharge, ear pain and tinnitus.   Eyes: Negative for blurred vision.  Respiratory: Negative for cough, shortness of breath and wheezing.   Cardiovascular: Negative for chest pain, palpitations and leg swelling.  Gastrointestinal: Positive for nausea and abdominal pain. Negative for vomiting, diarrhea and constipation.  Genitourinary: Negative for dysuria and urgency.  Musculoskeletal: Negative for myalgias and neck pain.  Neurological: Negative for dizziness, speech change, focal weakness, seizures and headaches.  Psychiatric/Behavioral: Negative for depression.    DRUG ALLERGIES:   Allergies  Allergen Reactions  . Codeine Other (See Comments)    Reaction: unknown  . Sulfa Antibiotics Hives    VITALS:  Blood pressure 135/71, pulse 81, temperature 98.7 F (37.1 C), temperature source Oral, resp. rate 18, height 5\' 2"  (1.575 m), weight 55.021 kg (121 lb 4.8 oz), SpO2 97 %.  PHYSICAL EXAMINATION:  Physical Exam  GENERAL:  82 y.o.-year-old patient elderly lying in the bed with no acute distress.  EYES: Pupils equal, round, reactive to light and accommodation. No scleral icterus. Extraocular muscles intact.  HEENT: Head atraumatic, normocephalic. Oropharynx and nasopharynx clear.  NECK:  Supple, no jugular venous distention. No thyroid enlargement, no tenderness.  LUNGS: Normal breath  sounds bilaterally, no wheezing, rales,rhonchi or crepitation. No use of accessory muscles of respiration.  CARDIOVASCULAR: S1, S2 normal. No murmurs, rubs, or gallops.  ABDOMEN: Soft, no tenderness on abdominal palpation, no right upper quadrant tenderness as well,, nondistended. Bowel sounds present. No organomegaly or mass.  EXTREMITIES: No pedal edema, cyanosis, or clubbing.  NEUROLOGIC: Cranial nerves II through XII are intact. Muscle strength 5/5 in all extremities. Sensation intact. Gait not checked.  PSYCHIATRIC: The patient is alert and oriented x 2.  SKIN: No obvious rash, lesion, or ulcer.    LABORATORY PANEL:   CBC  Recent Labs Lab 05/30/15 0518  WBC 5.7  HGB 12.3  HCT 36.8  PLT 178   ------------------------------------------------------------------------------------------------------------------  Chemistries   Recent Labs Lab 05/30/15 0518  NA 140  K 3.6  CL 106  CO2 27  GLUCOSE 82  BUN 23*  CREATININE 2.76*  CALCIUM 9.8  AST 16  ALT 9*  ALKPHOS 41  BILITOT 0.5   ------------------------------------------------------------------------------------------------------------------  Cardiac Enzymes No results for input(s): TROPONINI in the last 168 hours. ------------------------------------------------------------------------------------------------------------------  RADIOLOGY:  Ct Abdomen Pelvis Wo Contrast  05/29/2015  CLINICAL DATA:  Left-sided abdominal pain and right hip pain. Oral contrast only due to low GFR. EXAM: CT ABDOMEN AND PELVIS WITHOUT CONTRAST TECHNIQUE: Multidetector CT imaging of the abdomen and pelvis was performed following the standard protocol without IV contrast. COMPARISON:  07/14/2010 FINDINGS: Dependent atelectasis in the lung bases. Coronary artery calcifications. Mild cardiac enlargement. Small esophageal hiatal hernia. Cholelithiasis. Pneumobilia with gas seen in the common bile duct, intrahepatic bile ducts, and gallbladder  lumen. Pneumobilia was present on the prior study. Changes likely reflect sequela of previous sphincterotomy although patient does not  give a history of such surgery. Clinical correlation is recommended. There is a small amount of gas in the gallbladder wall. Emphysematous cholecystitis is not excluded. No bile duct dilatation. The unenhanced appearance of the liver, spleen, pancreas, adrenal glands, inferior vena cava, and retroperitoneal lymph nodes is otherwise unremarkable. Kidneys are atrophic. No hydronephrosis. Calcification of abdominal aorta without aneurysm. Calcifications and mesenteric and celiac axis branch vessels. Stomach, small bowel, and colon are not abnormally distended. Contrast material flows through to the colon without evidence of bowel obstruction. No wall thickening is appreciated. There are scattered diverticula throughout the colon. No free air or free fluid in the abdomen. Pelvis: Diverticulosis of the sigmoid colon without evidence of diverticulitis. Appendix is not identified. Bladder wall is not thickened. Uterus and ovaries are not enlarged. Visualization of the low pelvis is limited due to streak artifact from surgical hardware in the right hip and left hip prosthesis. Diffuse bone demineralization. Anterior compression of T11, unchanged since previous study. Degenerative changes in the spine and hips. No destructive bone lesions. IMPRESSION: Pneumobilia again demonstrated, similar to previous study. Clinical correlation for history of sphincterotomy suggested. Cholelithiasis. Small amount of gas is seen in the gallbladder wall suggesting possibility of emphysematous cholecystitis. No evidence of bowel obstruction or inflammation. Electronically Signed   By: Burman Nieves M.D.   On: 05/29/2015 02:49    EKG:   Orders placed or performed during the hospital encounter of 05/28/15  . EKG 12-Lead  . EKG 12-Lead  . ED EKG  . ED EKG    ASSESSMENT AND PLAN:   82 year old  female with past medical history significant for coronary artery disease, hypertension, CK D, diabetes mellitus presents to the hospital secondary to generalized weakness and noted to have cholecystitis.  1. Acute emphysematous cholecystitis-as noted on CT abdomen. Patient is minimally symptomatic with that. -Continue antibiotics with Zosyn. On full liquid diet-advance to soft diet today. -Again as per patient and family request, no surgical consultation done -Continue IV fluids, pain and nausea medicines.  2. Acute on chronic kidney disease-patient has CK D with baseline creatinine around 2.3 based on her outpatient cardiologist notes. -ATN on admission with renal failure -Improving with IV fluids. Creatinine at 2.7 today  3. Dementia-stable, at baseline. And tinea Aricept  4. hypertension-on metoprolol and Norvasc. Blood pressure is elevated. Increase metoprolol dose and also added Imdur.  5. DVT prophylaxis- SQ heparin  Physical Therapy consult today   All the records are reviewed and case discussed with Care Management/Social Workerr. Management plans discussed with the patient, family and they are in agreement.  CODE STATUS: DNR  TOTAL TIME TAKING CARE OF THIS PATIENT: 32 minutes.   POSSIBLE D/C IN 1 DAY, DEPENDING ON CLINICAL CONDITION.   Enid Baas M.D on 05/30/2015 at 11:11 AM  Between 7am to 6pm - Pager - [phone]  After 6pm go to [email or web address] - password [email or web address] [facility]  [place]  [facility]  Office  [phone]  CC: Primary care physician; Ginette Otto, MD

## 2015-05-31 LAB — BASIC METABOLIC PANEL
Anion gap: 5 (ref 5–15)
BUN: 22 mg/dL — AB (ref 6–20)
CHLORIDE: 111 mmol/L (ref 101–111)
CO2: 26 mmol/L (ref 22–32)
CREATININE: 2.91 mg/dL — AB (ref 0.44–1.00)
Calcium: 9.2 mg/dL (ref 8.9–10.3)
GFR calc Af Amer: 15 mL/min — ABNORMAL LOW (ref >60)
GFR, EST NON AFRICAN AMERICAN: 13 mL/min — AB (ref >60)
Glucose, Bld: 90 mg/dL (ref 65–99)
POTASSIUM: 3.6 mmol/L (ref 3.5–5.1)
Sodium: 142 mmol/L (ref 135–145)

## 2015-05-31 LAB — GLUCOSE, CAPILLARY: Glucose-Capillary: 87 mg/dL (ref 65–99)

## 2015-05-31 MED ORDER — DOCUSATE SODIUM 100 MG PO CAPS: 100.0000 mg | ORAL_CAPSULE | Freq: Two times a day (BID) | ORAL | Status: AC

## 2015-05-31 MED ORDER — ISOSORBIDE MONONITRATE ER 30 MG PO TB24: 30.0000 mg | ORAL_TABLET | Freq: Every day | ORAL | Status: AC

## 2015-05-31 MED ORDER — METOPROLOL TARTRATE 50 MG PO TABS: 50.0000 mg | ORAL_TABLET | Freq: Two times a day (BID) | ORAL | Status: AC

## 2015-05-31 MED ORDER — AMOXICILLIN-POT CLAVULANATE 875-125 MG PO TABS: 1.0000 | ORAL_TABLET | Freq: Two times a day (BID) | ORAL | Status: DC

## 2015-05-31 MED ORDER — HYDROCODONE-ACETAMINOPHEN 5-325 MG PO TABS: 1.0000 | ORAL_TABLET | Freq: Three times a day (TID) | ORAL | Status: DC | PRN

## 2015-05-31 MED ORDER — ONDANSETRON 4 MG PO TBDP
4.0000 mg | ORAL_TABLET | Freq: Three times a day (TID) | ORAL | Status: DC | PRN
Start: 2015-05-31 — End: 2015-10-01

## 2015-05-31 NOTE — Progress Notes (Signed)
Pt returning to [facility].  Gave info packet which included scripts, DNR and FL2 forms.  IVs removed.  Family transported.  Concerned that pt may need more assistance than can be provided by ALF.  Needs assistance w/dressing, toileting.  Pt very unsteady on her feet- took 2 people to accomodate.  Pt had couple of episodes of loose stools.

## 2015-05-31 NOTE — Clinical Social Work Note (Signed)
Pt is ready for discharge today and will return to [facility]. Facility has received discharge information and is ready to accept pt. Pt's family will provide transportation to facility. Pt and family are aware and agreeable to discharge plan. Pt's family will provide transportation to facility. RN is aware. CSW is signing off as no further needs identified.   Dede QuerySarah Cayne Yom, MSW, LCSW  Clinical Social Worker [phone]

## 2015-05-31 NOTE — Discharge Summary (Signed)
[facility] [place] [facility]   PATIENT NAME: Denise MenghiniMolly Neal    MR#:  [id number]  DATE OF BIRTH:  11-06-1921  DATE OF ADMISSION:  05/28/2015 ADMITTING PHYSICIAN: Arnaldo NatalMichael S Diamond, MD  DATE OF DISCHARGE: 05/31/2015  PRIMARY CARE PHYSICIAN: Ginette OttoSTONEKING,HAL THOMAS, MD    ADMISSION DIAGNOSIS:  Acute cholecystitis [K81.0] Renal insufficiency [N28.9]  DISCHARGE DIAGNOSIS:  Active Problems:   Cholecystitis   Pressure ulcer   SECONDARY DIAGNOSIS:   Past Medical History  Diagnosis Date  . Old myocardial infarction   . Essential (primary) hypertension   . Hyperlipidemia   . Diabetes mellitus without complication [facility](HCC)     HOSPITAL COURSE:   82 year old female with past medical history significant for coronary artery disease, hypertension, CK D, diabetes mellitus presents to the hospital secondary to generalized weakness and noted to have cholecystitis.  1. Acute emphysematous cholecystitis-as noted on CT abdomen. Patient is minimally symptomatic with that. -Received Zosyn in the hospital. Diet was advanced to soft diet yesterday. Improving. -Will be discharged on Augmentin. -Again as per patient and family request, no surgical consultation done as patient is not a surgical candidate. -Continue pain and nausea medicines as needed.  2. Acute on chronic kidney disease-patient has CK D with baseline creatinine around 2.3 based on her outpatient cardiologist notes from last year. -ATN on admission with renal failure -Improving with IV fluids -Monitor as outpatient  3. Dementia-stable, at baseline. Continue Aricept  4. hypertension-on metoprolol and Norvasc. Blood pressure is elevated. Increased metoprolol dose and also added Imdur.  Physical Therapy consulted and recommended home health  DISCHARGE CONDITIONS:   Guarded  CONSULTS OBTAINED:   None  DRUG ALLERGIES:   Allergies  Allergen Reactions  . Codeine Other (See Comments)    Reaction:  unknown  . Sulfa Antibiotics Hives    DISCHARGE MEDICATIONS:   Current Discharge Medication List    START taking these medications   Details  amoxicillin-clavulanate (AUGMENTIN) 875-125 MG tablet Take 1 tablet by mouth 2 (two) times daily. X 7 more days Qty: 14 tablet, Refills: 0    docusate sodium (COLACE) 100 MG capsule Take 1 capsule (100 mg total) by mouth 2 (two) times daily. Hold for > 2 bowel movements in a day Qty: 30 capsule, Refills: 0    isosorbide mononitrate (IMDUR) 30 MG 24 hr tablet Take 1 tablet (30 mg total) by mouth daily. Qty: 30 tablet, Refills: 2    ondansetron (ZOFRAN ODT) 4 MG disintegrating tablet Take 1 tablet (4 mg total) by mouth every 8 (eight) hours as needed for nausea or vomiting. Qty: 30 tablet, Refills: 0      CONTINUE these medications which have CHANGED   Details  HYDROcodone-acetaminophen (NORCO) 5-325 MG tablet Take 1 tablet by mouth every 8 (eight) hours as needed for moderate pain or severe pain. Qty: 20 tablet, Refills: 0    metoprolol (LOPRESSOR) 50 MG tablet Take 1 tablet (50 mg total) by mouth 2 (two) times daily. Qty: 60 tablet, Refills: 0      CONTINUE these medications which have NOT CHANGED   Details  acitretin (SORIATANE) 25 MG capsule Take 25 mg by mouth See admin instructions. Takes 25mg  five times weekly (Monday-Friday)    amLODipine (NORVASC) 5 MG tablet Take 5 mg by mouth daily.    calcium carbonate (OS-CAL) 600 MG TABS tablet Take 600 mg by mouth 2 (two) times daily with a meal.    Cholecalciferol (VITAMIN D) 2000 UNITS CAPS Take 2,000  Units by mouth daily.    donepezil (ARICEPT) 5 MG tablet Take 5 mg by mouth at bedtime.     mirtazapine (REMERON) 15 MG tablet Take 15 mg by mouth at bedtime.     PE-Shark Liver Oil-Cocoa Buttr (HEMORRHOID RE) Place 1 application rectally 4 (four) times daily as needed (hemorrhoid flare up).    ranitidine (ZANTAC) 150 MG tablet Take 150 mg by mouth at bedtime.    triamcinolone cream  (KENALOG) 0.1 % Apply 1 application topically 2 (two) times daily.    vitamin B-12 (CYANOCOBALAMIN) 1000 MCG tablet Take 2,000 mcg by mouth daily.         DISCHARGE INSTRUCTIONS:   1. PCP f/u in 1-2 weeks 2. Soft diet for the next 1 more day 3. Home health PT and nursing  If you experience worsening of your admission symptoms, develop shortness of breath, life threatening emergency, suicidal or homicidal thoughts you must seek medical attention immediately by calling 911 or calling your MD immediately  if symptoms less severe.  You Must read complete instructions/literature along with all the possible adverse reactions/side effects for all the Medicines you take and that have been prescribed to you. Take any new Medicines after you have completely understood and accept all the possible adverse reactions/side effects.   Please note  You were cared for by a hospitalist during your hospital stay. If you have any questions about your discharge medications or the care you received while you were in the hospital after you are discharged, you can call the unit and asked to speak with the hospitalist on call if the hospitalist that took care of you is not available. Once you are discharged, your primary care physician will handle any further medical issues. Please note that NO REFILLS for any discharge medications will be authorized once you are discharged, as it is imperative that you return to your primary care physician (or establish a relationship with a primary care physician if you do not have one) for your aftercare needs so that they can reassess your need for medications and monitor your lab values.    Today   CHIEF COMPLAINT:   Chief Complaint  Patient presents with  . Weakness    VITAL SIGNS:  Blood pressure 155/72, pulse 56, temperature 97.8 F (36.6 C), temperature source Oral, resp. rate 18, height 5\' 2"  (1.575 m), weight 55.475 kg (122 lb 4.8 oz), SpO2 97 %.  I/O:    Intake/Output Summary (Last 24 hours) at 05/31/15 0908 Last data filed at 05/30/15 1700  Gross per 24 hour  Intake    480 ml  Output      0 ml  Net    480 ml    PHYSICAL EXAMINATION:   Physical Exam  GENERAL: 82 y.o.-year-old patient elderly lying in the bed with no acute distress.  EYES: Pupils equal, round, reactive to light and accommodation. No scleral icterus. Extraocular muscles intact.  HEENT: Head atraumatic, normocephalic. Oropharynx and nasopharynx clear.  NECK: Supple, no jugular venous distention. No thyroid enlargement, no tenderness.  LUNGS: Normal breath sounds bilaterally, no wheezing, rales,rhonchi or crepitation. No use of accessory muscles of respiration.  CARDIOVASCULAR: S1, S2 normal. No rubs, or gallops. 2/6 systolic murmur present ABDOMEN: Soft, no tenderness on abdominal palpation, no right upper quadrant tenderness as well,, nondistended. Bowel sounds present. No organomegaly or mass.  EXTREMITIES: No pedal edema, cyanosis, or clubbing.  NEUROLOGIC: Cranial nerves II through XII are intact. Muscle strength 5/5 in all extremities.  Sensation intact. Gait not checked.  PSYCHIATRIC: The patient is alert and oriented x 2.  SKIN: No obvious rash, lesion, or ulcer.   DATA REVIEW:   CBC  Recent Labs Lab 05/30/15 0518  WBC 5.7  HGB 12.3  HCT 36.8  PLT 178    Chemistries   Recent Labs Lab 05/30/15 0518 05/31/15 0525  NA 140 142  K 3.6 3.6  CL 106 111  CO2 27 26  GLUCOSE 82 90  BUN 23* 22*  CREATININE 2.76* 2.91*  CALCIUM 9.8 9.2  AST 16  --   ALT 9*  --   ALKPHOS 41  --   BILITOT 0.5  --     Cardiac Enzymes No results for input(s): TROPONINI in the last 168 hours.  Microbiology Results  Results for orders placed or performed during the hospital encounter of 05/28/15  Blood culture (routine x 2)     Status: None (Preliminary result)   Collection Time: 05/29/15  3:33 AM  Result Value Ref Range Status   Specimen Description  BLOOD LEFT HAND  Final   Special Requests BOTTLES DRAWN AEROBIC AND ANAEROBIC 8CC  Final   Culture NO GROWTH 2 DAYS  Final   Report Status PENDING  Incomplete  Blood culture (routine x 2)     Status: None (Preliminary result)   Collection Time: 05/29/15  3:38 AM  Result Value Ref Range Status   Specimen Description BLOOD BLOOD RIGHT FOREARM  Final   Special Requests   Final    BOTTLES DRAWN AEROBIC AND ANAEROBIC 10ANAEROBIC,   Culture NO GROWTH 2 DAYS  Final   Report Status PENDING  Incomplete  MRSA PCR Screening     Status: None   Collection Time: 05/29/15 11:46 AM  Result Value Ref Range Status   MRSA by PCR NEGATIVE NEGATIVE Final    Comment:        The GeneXpert MRSA Assay (FDA approved for NASAL specimens only), is one component of a comprehensive MRSA colonization surveillance program. It is not intended to diagnose MRSA infection nor to guide or monitor treatment for MRSA infections.     RADIOLOGY:  No results found.  EKG:   Orders placed or performed during the hospital encounter of 05/28/15  . EKG 12-Lead  . EKG 12-Lead  . ED EKG  . ED EKG      Management plans discussed with the patient, family and they are in agreement.  CODE STATUS:     Code Status Orders        Start     Ordered   05/29/15 0618  Do not attempt resuscitation (DNR)   Continuous    Question Answer Comment  In the event of cardiac or respiratory ARREST Do not call a "code blue"   In the event of cardiac or respiratory ARREST Do not perform Intubation, CPR, defibrillation or ACLS   In the event of cardiac or respiratory ARREST Use medication by any route, position, wound care, and other measures to relive pain and suffering. May use oxygen, suction and manual treatment of airway obstruction as needed for comfort.      05/29/15 0617    Code Status History    Date Active Date Inactive Code Status Order ID Comments User Context   This patient has a current code status but  no historical code status.    Advance Directive Documentation        Most Recent Value   Type of Advance Directive  Living will, Healthcare Power of Attorney   Pre-existing out of facility DNR order (yellow form or pink MOST form)  Physician notified to receive inpatient order   "MOST" Form in Place?        TOTAL TIME TAKING CARE OF THIS PATIENT: 37 minutes.    Enid Baas M.D on 05/31/2015 at 9:08 AM  Between 7am to 6pm - Pager - [phone]  After 6pm go to [email or web address] - password [email or web address] [facility]  [place] [facility]  Office  [phone]  CC: Primary care physician; Ginette Otto, MD

## 2015-05-31 NOTE — [place] FL2 (Signed)
[place] MEDICAID FL2 LEVEL OF CARE SCREENING TOOL     IDENTIFICATION  Patient Name: [name] Birthdate: [birth year] Sex: female Admission Date (Current Location): 05/28/2015  [place] Number:  Chiropodist and Address:  [facility], [address], [place], [place] [address]      Provider Number: [id number]  Attending Physician Name and Address:  Enid Baas, MD  Relative Name and Phone Number:       Current Level of Care: Hospital Recommended Level of Care: Assisted Living Facility Prior Approval Number:    Date Approved/Denied:   PASRR Number:    Discharge Plan: Domiciliary (Rest home)    Current Diagnoses: Patient Active Problem List   Diagnosis Date Noted  . Cholecystitis 05/29/2015  . Pressure ulcer 05/29/2015  . Old myocardial infarction 08/29/2013  . Essential hypertension 08/29/2013  . Hyperlipidemia 08/29/2013    Orientation RESPIRATION BLADDER Height & Weight     Self  Normal Continent Weight: 122 lb 4.8 oz (55.475 kg) Height:   (157.5 cm)  BEHAVIORAL SYMPTOMS/MOOD NEUROLOGICAL BOWEL NUTRITION STATUS      Incontinent Diet (Soft Diet)  AMBULATORY STATUS COMMUNICATION OF NEEDS Skin   Limited Assist Verbally Normal                       Personal Care Assistance Level of Assistance  Bathing, Feeding, Dressing Bathing Assistance: Limited assistance Feeding assistance: Limited assistance Dressing Assistance: Limited assistance     Functional Limitations Info  Sight, Hearing, Speech Sight Info: Adequate Hearing Info: Adequate Speech Info: Adequate    SPECIAL CARE FACTORS FREQUENCY  PT (By licensed PT)     PT Frequency: Home Health              Contractures      Additional Factors Info  Code Status, Allergies Code Status Info: DNR Allergies Info: Codeine, Sulfa Antibiotics           Current Medications (05/31/2015):  This is the current hospital active medication  list Current Facility-Administered Medications  Medication Dose Route Frequency Provider Last Rate Last Dose  . 0.9 %  sodium chloride infusion   Intravenous Continuous Milagros Loll, MD 75 mL/hr at 05/29/15 1719    . acetaminophen (TYLENOL) tablet 650 mg  650 mg Oral Q6H PRN Arnaldo Natal, MD       Or  . acetaminophen (TYLENOL) suppository 650 mg  650 mg Rectal Q6H PRN Arnaldo Natal, MD      . acitretin [facility]) capsule 25 mg  25 mg Oral Q M,W,F Arnaldo Natal, MD   25 mg at 05/29/15 1517  . [START ON 06/02/2015] acitretin (SORIATANE) capsule 25 mg  25 mg Oral Q Tue Srikar Sudini, MD      . Melene Muller ON 06/04/2015] acitretin (SORIATANE) capsule 25 mg  25 mg Oral Q Thu Srikar Sudini, MD      . amLODipine (NORVASC) tablet 5 mg  5 mg Oral Daily Arnaldo Natal, MD   5 mg at 05/30/15 1026  . calcium carbonate (TUMS - dosed in mg elemental calcium) chewable tablet 200 mg of elemental calcium  1 tablet Oral BID WC Arnaldo Natal, MD   200 mg of elemental calcium at 05/30/15 1547  . cholecalciferol (VITAMIN D) tablet 2,000 Units  2,000 Units Oral Daily Arnaldo Natal, MD   2,000 Units at 05/30/15 1021  . docusate sodium (COLACE) capsule 100 mg  100 mg Oral  BID Arnaldo NatalMichael S Diamond, MD   100 mg at 05/30/15 2056  . donepezil (ARICEPT) tablet 5 mg  5 mg Oral QHS Arnaldo NatalMichael S Diamond, MD   5 mg at 05/30/15 2056  . famotidine (PEPCID) tablet 20 mg  20 mg Oral Daily Arnaldo NatalMichael S Diamond, MD   20 mg at 05/30/15 1022  . heparin injection 5,000 Units  5,000 Units Subcutaneous Q8H Arnaldo NatalMichael S Diamond, MD   5,000 Units at 05/31/15 [phone]  . HYDROcodone-acetaminophen (NORCO/VICODIN) 5-325 MG per tablet 1 tablet  1 tablet Oral Q8H PRN Arnaldo NatalMichael S Diamond, MD      . isosorbide mononitrate (IMDUR) 24 hr tablet 30 mg  30 mg Oral Daily Enid Baasadhika Kalisetti, MD   30 mg at 05/30/15 1047  . metoprolol (LOPRESSOR) tablet 50 mg  50 mg Oral BID Enid Baasadhika Kalisetti, MD   50 mg at 05/31/15 0542  . mirtazapine (REMERON) tablet 15  mg  15 mg Oral QHS Arnaldo NatalMichael S Diamond, MD   15 mg at 05/30/15 2056  . morphine 2 MG/ML injection 1 mg  1 mg Intravenous Q4H PRN Arnaldo NatalMichael S Diamond, MD      . ondansetron [facility](ZOFRAN) tablet 4 mg  4 mg Oral Q6H PRN Arnaldo NatalMichael S Diamond, MD       Or  . ondansetron [facility](ZOFRAN) injection 4 mg  4 mg Intravenous Q6H PRN Arnaldo NatalMichael S Diamond, MD      . phenylephrine ((USE for PREPARATION-H)) 0.25 % suppository 1 suppository  1 suppository Rectal BID PRN Arnaldo NatalMichael S Diamond, MD      . piperacillin-tazobactam (ZOSYN) IVPB 3.375 g  3.375 g Intravenous Q12H Arnaldo NatalMichael S Diamond, MD   3.375 g at 05/31/15 0413  . triamcinolone cream (KENALOG) 0.1 % 1 application  1 application Topical BID Arnaldo NatalMichael S Diamond, MD   1 application at 05/30/15 2200  . vitamin B-12 (CYANOCOBALAMIN) tablet 2,000 mcg  2,000 mcg Oral Daily Arnaldo NatalMichael S Diamond, MD   2,000 mcg at 05/30/15 1021     Discharge Medications: Please see discharge summary for a list of discharge medications.  Current Discharge Medication List    START taking these medications   Details  amoxicillin-clavulanate (AUGMENTIN) 875-125 MG tablet Take 1 tablet by mouth 2 (two) times daily. X 7 more days Qty: 14 tablet, Refills: 0    docusate sodium (COLACE) 100 MG capsule Take 1 capsule (100 mg total) by mouth 2 (two) times daily. Hold for > 2 bowel movements in a day Qty: 30 capsule, Refills: 0    isosorbide mononitrate (IMDUR) 30 MG 24 hr tablet Take 1 tablet (30 mg total) by mouth daily. Qty: 30 tablet, Refills: 2    ondansetron (ZOFRAN ODT) 4 MG disintegrating tablet Take 1 tablet (4 mg total) by mouth every 8 (eight) hours as needed for nausea or vomiting. Qty: 30 tablet, Refills: 0      CONTINUE these medications which have CHANGED   Details  HYDROcodone-acetaminophen (NORCO) 5-325 MG tablet Take 1 tablet by mouth every 8 (eight) hours as needed for moderate pain or severe pain. Qty: 20 tablet, Refills: 0    metoprolol (LOPRESSOR) 50 MG  tablet Take 1 tablet (50 mg total) by mouth 2 (two) times daily. Qty: 60 tablet, Refills: 0      CONTINUE these medications which have NOT CHANGED   Details  acitretin (SORIATANE) 25 MG capsule Take 25 mg by mouth See admin instructions. Takes 25mg  five times weekly (Monday-Friday)    amLODipine (NORVASC) 5 MG tablet Take 5  mg by mouth daily.    calcium carbonate (OS-CAL) 600 MG TABS tablet Take 600 mg by mouth 2 (two) times daily with a meal.    Cholecalciferol (VITAMIN D) 2000 UNITS CAPS Take 2,000 Units by mouth daily.    donepezil (ARICEPT) 5 MG tablet Take 5 mg by mouth at bedtime.     mirtazapine (REMERON) 15 MG tablet Take 15 mg by mouth at bedtime.     PE-Shark Liver Oil-Cocoa Buttr (HEMORRHOID RE) Place 1 application rectally 4 (four) times daily as needed (hemorrhoid flare up).    ranitidine (ZANTAC) 150 MG tablet Take 150 mg by mouth at bedtime.    triamcinolone cream (KENALOG) 0.1 % Apply 1 application topically 2 (two) times daily.    vitamin B-12 (CYANOCOBALAMIN) 1000 MCG tablet Take 2,000 mcg by mouth daily.           Relevant Imaging Results:  Relevant Lab Results:   Additional Information SSN: [id number]  Dede Query, LCSW

## 2015-05-31 NOTE — Care Management Note (Signed)
Case Management Note  Patient Details  Name: [name] MRN: [id number] Date of Birth: 17-Dec-1921  Subjective/Objective:   [name] will be discharged [facility] which uses [facility] in their facilities. A referral for home health PT and RN was called to Linus OrnAbby Perkins at [facility].                  Action/Plan:   Expected Discharge Date:                  Expected Discharge Plan:     In-House Referral:     Discharge planning Services     Post Acute Care Choice:    Choice offered to:     DME Arranged:    DME Agency:     HH Arranged:    HH Agency:     Status of Service:     Medicare Important Message Given:    Date Medicare IM Given:    Medicare IM give by:    Date Additional Medicare IM Given:    Additional Medicare Important Message give by:     If discussed at Long Length of Stay Meetings, dates discussed:    Additional Comments:  Greydis Stlouis A, RN 05/31/2015, 9:15 AM

## 2015-06-03 LAB — CULTURE, BLOOD (ROUTINE X 2)
CULTURE: NO GROWTH
Culture: NO GROWTH

## 2015-06-13 ENCOUNTER — Emergency Department
Admission: EM | Admit: 2015-06-13 | Discharge: 2015-06-13 | Disposition: A | Discharge status: Home / Self Care | Payer: Medicare Other | Attending: Emergency Medicine | Admitting: Emergency Medicine

## 2015-06-13 DIAGNOSIS — E119 Type 2 diabetes mellitus without complications: Secondary | ICD-10-CM | POA: Diagnosis not present

## 2015-06-13 DIAGNOSIS — I252 Old myocardial infarction: Secondary | ICD-10-CM | POA: Diagnosis not present

## 2015-06-13 DIAGNOSIS — Z79899 Other long term (current) drug therapy: Secondary | ICD-10-CM | POA: Diagnosis not present

## 2015-06-13 DIAGNOSIS — R531 Weakness: Secondary | ICD-10-CM | POA: Diagnosis not present

## 2015-06-13 DIAGNOSIS — E785 Hyperlipidemia, unspecified: Secondary | ICD-10-CM | POA: Insufficient documentation

## 2015-06-13 DIAGNOSIS — I10 Essential (primary) hypertension: Secondary | ICD-10-CM | POA: Insufficient documentation

## 2015-06-13 LAB — COMPREHENSIVE METABOLIC PANEL
ALBUMIN: 3.6 g/dL (ref 3.5–5.0)
ALK PHOS: 49 U/L (ref 38–126)
ALT: 8 U/L — AB (ref 14–54)
AST: 18 U/L (ref 15–41)
Anion gap: 11 (ref 5–15)
BILIRUBIN TOTAL: 0.6 mg/dL (ref 0.3–1.2)
BUN: 31 mg/dL — AB (ref 6–20)
CALCIUM: 11.4 mg/dL — AB (ref 8.9–10.3)
CO2: 26 mmol/L (ref 22–32)
CREATININE: 2.84 mg/dL — AB (ref 0.44–1.00)
Chloride: 101 mmol/L (ref 101–111)
GFR calc Af Amer: 16 mL/min — ABNORMAL LOW (ref >60)
GFR, EST NON AFRICAN AMERICAN: 13 mL/min — AB (ref >60)
GLUCOSE: 131 mg/dL — AB (ref 65–99)
POTASSIUM: 4.1 mmol/L (ref 3.5–5.1)
Sodium: 138 mmol/L (ref 135–145)
TOTAL PROTEIN: 6.8 g/dL (ref 6.5–8.1)

## 2015-06-13 LAB — CBC WITH DIFFERENTIAL/PLATELET
BASOS ABS: 0.1 10*3/uL (ref 0–0.1)
Basophils Relative: 1 %
EOS ABS: 0.2 10*3/uL (ref 0–0.7)
EOS PCT: 3 %
HCT: 40.2 % (ref 35.0–47.0)
Hemoglobin: 13.7 g/dL (ref 12.0–16.0)
LYMPHS PCT: 29 %
Lymphs Abs: 2 10*3/uL (ref 1.0–3.6)
MCH: 28.5 pg (ref 26.0–34.0)
MCHC: 34.2 g/dL (ref 32.0–36.0)
MCV: 83.3 fL (ref 80.0–100.0)
MONO ABS: 0.5 10*3/uL (ref 0.2–0.9)
Monocytes Relative: 8 %
Neutro Abs: 4.1 10*3/uL (ref 1.4–6.5)
Neutrophils Relative %: 59 %
PLATELETS: 229 10*3/uL (ref 150–440)
RBC: 4.82 MIL/uL (ref 3.80–5.20)
RDW: 15 % — AB (ref 11.5–14.5)
WBC: 7 10*3/uL (ref 3.6–11.0)

## 2015-06-13 LAB — URINALYSIS COMPLETE WITH MICROSCOPIC (ARMC ONLY)
BILIRUBIN URINE: NEGATIVE
GLUCOSE, UA: NEGATIVE mg/dL
Hgb urine dipstick: NEGATIVE
KETONES UR: NEGATIVE mg/dL
Leukocytes, UA: NEGATIVE
Nitrite: NEGATIVE
Protein, ur: >500 mg/dL — AB
SQUAMOUS EPITHELIAL / LPF: NONE SEEN
Specific Gravity, Urine: 1.015 (ref 1.005–1.030)
pH: 5 (ref 5.0–8.0)

## 2015-06-13 LAB — TROPONIN I: TROPONIN I: 0.04 ng/mL — AB (ref <0.031)

## 2015-06-13 MED ORDER — SODIUM CHLORIDE 0.9 % IV BOLUS (SEPSIS)
1000.0000 mL | Freq: Once | INTRAVENOUS | Status: AC
  Administered 2015-06-13: 1000 mL via INTRAVENOUS

## 2015-06-13 NOTE — ED Provider Notes (Signed)
[facility] Emergency Department Provider Note   ____________________________________________  Time seen: Approximately 4pm I have reviewed the triage vital signs and the triage nursing note.  HISTORY  Chief Complaint Altered Mental Status and Weakness   [facility] Nursing [facility] report  HPI [name] is a 82 y.o. female here from nursing home with report of generalized weakness. There is a report that she may have some altered mental status, however unclear what this is referring to. She does have Aricept listed on her medication list.  There is report that she had not voided today per staff, the patient states that she has.  Patient states she is in no pain. No trouble breathing. Denies dysuria or other symptoms of a urinary tract infection. She states she might be a little weak all over. No focal weakness.    Past Medical History  Diagnosis Date  . Old myocardial infarction   . Essential (primary) hypertension   . Hyperlipidemia   . Diabetes mellitus without complication [facility](HCC)     Patient Active Problem List   Diagnosis Date Noted  . Cholecystitis 05/29/2015  . Pressure ulcer 05/29/2015  . Old myocardial infarction 08/29/2013  . Essential hypertension 08/29/2013  . Hyperlipidemia 08/29/2013    Past Surgical History  Procedure Laterality Date  . Appendectomy      Current Outpatient Rx  Name  Route  Sig  Dispense  Refill  . acitretin (SORIATANE) 25 MG capsule   Oral   Take 25 mg by mouth See admin instructions. Takes 25mg  five times weekly (Monday-Friday)         . amLODipine (NORVASC) 5 MG tablet   Oral   Take 5 mg by mouth daily.         Marland Kitchen. amoxicillin-clavulanate (AUGMENTIN) 875-125 MG tablet   Oral   Take 1 tablet by mouth 2 (two) times daily. X 7 more days   14 tablet   0   . calcium carbonate (OS-CAL) 600 MG TABS tablet   Oral   Take 600 mg by mouth 2 (two) times daily with a meal.         . Cholecalciferol (VITAMIN  D) 2000 UNITS CAPS   Oral   Take 2,000 Units by mouth daily.         Marland Kitchen. docusate sodium (COLACE) 100 MG capsule   Oral   Take 1 capsule (100 mg total) by mouth 2 (two) times daily. Hold for > 2 bowel movements in a day   30 capsule   0   . donepezil (ARICEPT) 5 MG tablet   Oral   Take 5 mg by mouth at bedtime.          Marland Kitchen. HYDROcodone-acetaminophen (NORCO) 5-325 MG tablet   Oral   Take 1 tablet by mouth every 8 (eight) hours as needed for moderate pain or severe pain.   20 tablet   0   . isosorbide mononitrate (IMDUR) 30 MG 24 hr tablet   Oral   Take 1 tablet (30 mg total) by mouth daily.   30 tablet   2   . metoprolol (LOPRESSOR) 50 MG tablet   Oral   Take 1 tablet (50 mg total) by mouth 2 (two) times daily.   60 tablet   0   . mirtazapine (REMERON) 15 MG tablet   Oral   Take 15 mg by mouth at bedtime.          . ondansetron (ZOFRAN ODT) 4 MG disintegrating tablet  Oral   Take 1 tablet (4 mg total) by mouth every 8 (eight) hours as needed for nausea or vomiting.   30 tablet   0   . PE-Shark Liver Oil-Cocoa Buttr (HEMORRHOID RE)   Rectal   Place 1 application rectally 4 (four) times daily as needed (hemorrhoid flare up).         . ranitidine (ZANTAC) 150 MG tablet   Oral   Take 150 mg by mouth at bedtime.         . triamcinolone cream (KENALOG) 0.1 %   Topical   Apply 1 application topically 2 (two) times daily.         . vitamin B-12 (CYANOCOBALAMIN) 1000 MCG tablet   Oral   Take 2,000 mcg by mouth daily.           Allergies Codeine and Sulfa antibiotics  Family History  Problem Relation Age of Onset  . CAD Father     Social History Social History  Substance Use Topics  . Smoking status: Never Smoker   . Smokeless tobacco: Never Used  . Alcohol Use: No    Review of Systems  Constitutional: Negative for fever. Eyes: Negative for visual changes. ENT: Negative for sore throat. Cardiovascular: Negative for chest  pain. Respiratory: Negative for shortness of breath. Gastrointestinal: Negative for abdominal pain, vomiting and diarrhea. Genitourinary: Negative for dysuria. Musculoskeletal: Negative for back pain. Skin: Negative for rash. Neurological: Negative for headache. 10 point Review of Systems otherwise negative ____________________________________________   PHYSICAL EXAM:  VITAL SIGNS: ED Triage Vitals  Enc Vitals Group     BP 06/13/15 1610 154/74 mmHg     Pulse Rate 06/13/15 1610 59     Resp 06/13/15 1610 23     Temp 06/13/15 1610 98.1 F (36.7 C)     Temp Source 06/13/15 1610 Oral     SpO2 06/13/15 1610 97 %     Weight 06/13/15 1610 120 lb (54.432 kg)     Height 06/13/15 1610 5\' 2"  (1.575 m)     Head Cir --      Peak Flow --      Pain Score 06/13/15 1607 0     Pain Loc --      Pain Edu? --      Excl. in GC? --      Constitutional: Alert and oriented. Well appearing and in no distress. HEENT   Head: Normocephalic and atraumatic.      Eyes: Conjunctivae are normal. PERRL. Normal extraocular movements.      Ears:         Nose: No congestion/rhinnorhea.   Mouth/Throat: Mucous membranes are moist.   Neck: No stridor. Cardiovascular/Chest: Normal rate, regular rhythm.  No murmurs, rubs, or gallops. Respiratory: Normal respiratory effort without tachypnea nor retractions. Breath sounds are clear and equal bilaterally. No wheezes/rales/rhonchi. Gastrointestinal: Soft. No distention, no guarding, no rebound. Nontender.    Genitourinary/rectal:Deferred Musculoskeletal: Nontender with normal range of motion in all extremities. No joint effusions.  No lower extremity tenderness.  No edema. Neurologic:  Normal speech and language. No gross or focal neurologic deficits are appreciated.Diffuse generalized weakness, but no focal weakness. Skin:  Skin is warm, dry and intact. No rash noted. Psychiatric: Mood and affect are normal. Speech and behavior are normal. Patient  exhibits appropriate insight and judgment.  ____________________________________________   EKG I, Governor Rooks, MD, the attending physician have personally viewed and interpreted all ECGs.  66 bpm. Left axis deviation. Nonspecific intraventricular  conduction delay. Nonspecific T-wave ____________________________________________  LABS (pertinent positives/negatives)  Urinalysis rare bacteria and otherwise negative Comprehensive metabolic panel significant for BUN 31 and creatinine 2.84 White blood cell count 7.0, hemoglobin 13.7 and platelet count 29 Troponin 0.04  ____________________________________________  RADIOLOGY All Xrays were viewed by me. Imaging interpreted by Radiologist.  None __________________________________________  PROCEDURES  Procedure(s) performed: None  Critical Care performed: None  ____________________________________________   ED COURSE / ASSESSMENT AND PLAN  Pertinent labs & imaging results that were available during my care of the patient were reviewed by me and considered in my medical decision making (see chart for details).  Complaint of generalized weakness and partial altered mental status, the patient seems alert and oriented here and gives appropriate history and answers.  She has no focal weakness are not concerned about stroke or intracranial emergency.   No complaint of chest pain or shortness of breath or worse symptoms concerning for ACS. Symptoms of weakness and going on all day, EKG is nonspecific, and troponin is 0.04.   I did speak with the daughter is at the bedside who states that it seems like her mother has been having a bit of a decline in terms of the dementia and also physical strength. She thinks that she is receiving physical therapy possibly 2 days a week at the nursing home but she is not completely sure.  As far as today, the BUN and creatinine are little elevated, questionably are round baseline, but able to give  some fluids in case this is slight worsening with an element of dehydration.   CONSULTATIONS:  None  Patient / Family / Caregiver informed of clinical course, medical decision-making process, and agree with plan.   I discussed return precautions, follow-up instructions, and discharged instructions with patient and/or family.   ___________________________________________   FINAL CLINICAL IMPRESSION(S) / ED DIAGNOSES   Final diagnoses:  Generalized weakness              Note: This dictation was prepared with Dragon dictation. Any transcriptional errors that result from this process are unintentional   Governor Rooks, MD 06/13/15 1735

## 2015-06-13 NOTE — Discharge Instructions (Signed)
You were evaluated for generalized weakness, and although no certain cause was found, your exam and evaluation are reassuring in the emergency department today. You were given IV fluids for mild acute on chronic renal failure, likely due today elevated dehydration.  Return to the emergency room for any worsening condition including new confusion or altered mental status, one-sided weakness or numbness, problems with speech, fever, or any complaint of pain.   Weakness Weakness is a lack of strength. It may be felt all over the body (generalized) or in one specific part of the body (focal). Some causes of weakness can be serious. You may need further medical evaluation, especially if you are elderly or you have a history of immunosuppression (such as chemotherapy or HIV), kidney disease, heart disease, or diabetes. CAUSES  Weakness can be caused by many different things, including:  Infection.  Physical exhaustion.  Internal bleeding or other blood loss that results in a lack of red blood cells (anemia).  Dehydration. This cause is more common in elderly people.  Side effects or electrolyte abnormalities from medicines, such as pain medicines or sedatives.  Emotional distress, anxiety, or depression.  Circulation problems, especially severe peripheral arterial disease.  Heart disease, such as rapid atrial fibrillation, bradycardia, or heart failure.  Nervous system disorders, such as Guillain-Barr syndrome, multiple sclerosis, or stroke. DIAGNOSIS  To find the cause of your weakness, your caregiver will take your history and perform a physical exam. Lab tests or X-rays may also be ordered, if needed. TREATMENT  Treatment of weakness depends on the cause of your symptoms and can vary greatly. HOME CARE INSTRUCTIONS   Rest as needed.  Eat a well-balanced diet.  Try to get some exercise every day.  Only take over-the-counter or prescription medicines as directed by your  caregiver. SEEK MEDICAL CARE IF:   Your weakness seems to be getting worse or spreads to other parts of your body.  You develop new aches or pains. SEEK IMMEDIATE MEDICAL CARE IF:   You cannot perform your normal daily activities, such as getting dressed and feeding yourself.  You cannot walk up and down stairs, or you feel exhausted when you do so.  You have shortness of breath or chest pain.  You have difficulty moving parts of your body.  You have weakness in only one area of the body or on only one side of the body.  You have a fever.  You have trouble speaking or swallowing.  You cannot control your bladder or bowel movements.  You have black or bloody vomit or stools. MAKE SURE YOU:  Understand these instructions.  Will watch your condition.  Will get help right away if you are not doing well or get worse.   This information is not intended to replace advice given to you by your health care provider. Make sure you discuss any questions you have with your health care provider.   Document Released: 01/24/2005 Document Revised: 07/26/2011 Document Reviewed: 03/25/2011 Elsevier Interactive Patient Education [employer]2016 Elsevier Inc.

## 2015-06-13 NOTE — ED Notes (Signed)
Reviewed d/c instructions, follow-up care, and signs of dehydration with pt and family. Pt and family verbalized understanding.

## 2015-06-13 NOTE — ED Notes (Signed)
Per EMS: Pt comes from [facility] , per caregivers pt is weak and altered. Per staff, pt has not voided. Pt reports she has voided.

## 2015-06-23 ENCOUNTER — Encounter: Payer: Self-pay | Admitting: *Deleted

## 2015-06-23 ENCOUNTER — Observation Stay
Admission: EM | Admit: 2015-06-23 | Discharge: 2015-06-25 | Disposition: A | Discharge status: Home / Self Care | Payer: Medicare Other | Attending: Internal Medicine | Admitting: Internal Medicine

## 2015-06-23 DIAGNOSIS — I252 Old myocardial infarction: Secondary | ICD-10-CM | POA: Diagnosis not present

## 2015-06-23 DIAGNOSIS — E1122 Type 2 diabetes mellitus with diabetic chronic kidney disease: Secondary | ICD-10-CM | POA: Insufficient documentation

## 2015-06-23 DIAGNOSIS — G9341 Metabolic encephalopathy: Secondary | ICD-10-CM | POA: Diagnosis not present

## 2015-06-23 DIAGNOSIS — M1990 Unspecified osteoarthritis, unspecified site: Secondary | ICD-10-CM | POA: Diagnosis not present

## 2015-06-23 DIAGNOSIS — N179 Acute kidney failure, unspecified: Secondary | ICD-10-CM | POA: Diagnosis present

## 2015-06-23 DIAGNOSIS — N184 Chronic kidney disease, stage 4 (severe): Secondary | ICD-10-CM | POA: Diagnosis not present

## 2015-06-23 DIAGNOSIS — F0390 Unspecified dementia without behavioral disturbance: Secondary | ICD-10-CM | POA: Diagnosis not present

## 2015-06-23 DIAGNOSIS — Z66 Do not resuscitate: Secondary | ICD-10-CM | POA: Insufficient documentation

## 2015-06-23 DIAGNOSIS — R531 Weakness: Secondary | ICD-10-CM | POA: Insufficient documentation

## 2015-06-23 DIAGNOSIS — E860 Dehydration: Secondary | ICD-10-CM | POA: Insufficient documentation

## 2015-06-23 DIAGNOSIS — Z8249 Family history of ischemic heart disease and other diseases of the circulatory system: Secondary | ICD-10-CM | POA: Insufficient documentation

## 2015-06-23 DIAGNOSIS — Z885 Allergy status to narcotic agent status: Secondary | ICD-10-CM | POA: Insufficient documentation

## 2015-06-23 DIAGNOSIS — Z882 Allergy status to sulfonamides status: Secondary | ICD-10-CM | POA: Diagnosis not present

## 2015-06-23 DIAGNOSIS — R410 Disorientation, unspecified: Secondary | ICD-10-CM | POA: Insufficient documentation

## 2015-06-23 DIAGNOSIS — E785 Hyperlipidemia, unspecified: Secondary | ICD-10-CM | POA: Diagnosis not present

## 2015-06-23 DIAGNOSIS — N390 Urinary tract infection, site not specified: Secondary | ICD-10-CM | POA: Diagnosis not present

## 2015-06-23 DIAGNOSIS — I129 Hypertensive chronic kidney disease with stage 1 through stage 4 chronic kidney disease, or unspecified chronic kidney disease: Secondary | ICD-10-CM | POA: Insufficient documentation

## 2015-06-23 HISTORY — DX: Rhabdomyolysis: M62.82

## 2015-06-23 HISTORY — DX: Anemia, unspecified: D64.9

## 2015-06-23 HISTORY — DX: Unspecified osteoarthritis, unspecified site: M19.90

## 2015-06-23 LAB — URINALYSIS COMPLETE WITH MICROSCOPIC (ARMC ONLY)
BILIRUBIN URINE: NEGATIVE
GLUCOSE, UA: NEGATIVE mg/dL
Hgb urine dipstick: NEGATIVE
KETONES UR: NEGATIVE mg/dL
NITRITE: NEGATIVE
PH: 5 (ref 5.0–8.0)
Protein, ur: 100 mg/dL — AB
Specific Gravity, Urine: 1.005 (ref 1.005–1.030)

## 2015-06-23 LAB — COMPREHENSIVE METABOLIC PANEL
ALK PHOS: 51 U/L (ref 38–126)
ALT: 9 U/L — AB (ref 14–54)
AST: 19 U/L (ref 15–41)
Albumin: 4 g/dL (ref 3.5–5.0)
Anion gap: 8 (ref 5–15)
BILIRUBIN TOTAL: 0.5 mg/dL (ref 0.3–1.2)
BUN: 39 mg/dL — AB (ref 6–20)
CO2: 30 mmol/L (ref 22–32)
CREATININE: 3.44 mg/dL — AB (ref 0.44–1.00)
Calcium: 11.9 mg/dL — ABNORMAL HIGH (ref 8.9–10.3)
Chloride: 100 mmol/L — ABNORMAL LOW (ref 101–111)
GFR calc Af Amer: 12 mL/min — ABNORMAL LOW (ref >60)
GFR, EST NON AFRICAN AMERICAN: 11 mL/min — AB (ref >60)
GLUCOSE: 107 mg/dL — AB (ref 65–99)
Potassium: 3.8 mmol/L (ref 3.5–5.1)
Sodium: 138 mmol/L (ref 135–145)
TOTAL PROTEIN: 7.7 g/dL (ref 6.5–8.1)

## 2015-06-23 LAB — CBC
HCT: 40.1 % (ref 35.0–47.0)
Hemoglobin: 13.2 g/dL (ref 12.0–16.0)
MCH: 28.4 pg (ref 26.0–34.0)
MCHC: 33 g/dL (ref 32.0–36.0)
MCV: 86 fL (ref 80.0–100.0)
PLATELETS: 218 10*3/uL (ref 150–440)
RBC: 4.66 MIL/uL (ref 3.80–5.20)
RDW: 15.1 % — AB (ref 11.5–14.5)
WBC: 7.1 10*3/uL (ref 3.6–11.0)

## 2015-06-23 LAB — MRSA PCR SCREENING: MRSA by PCR: NEGATIVE

## 2015-06-23 MED ORDER — FAMOTIDINE 20 MG PO TABS
20.0000 mg | ORAL_TABLET | ORAL | Status: DC
  Administered 2015-06-23: 20 mg via ORAL
  Filled 2015-06-23: qty 1

## 2015-06-23 MED ORDER — MIRTAZAPINE 15 MG PO TABS
15.0000 mg | ORAL_TABLET | Freq: Every day | ORAL | Status: DC
  Administered 2015-06-23 – 2015-06-24 (×2): 15 mg via ORAL
  Filled 2015-06-23 (×2): qty 1

## 2015-06-23 MED ORDER — DEXTROSE 5 % IV SOLN
1.0000 g | INTRAVENOUS | Status: DC
  Administered 2015-06-23: 1 g via INTRAVENOUS
  Filled 2015-06-23 (×2): qty 10

## 2015-06-23 MED ORDER — AMLODIPINE BESYLATE 5 MG PO TABS
5.0000 mg | ORAL_TABLET | Freq: Every day | ORAL | Status: DC
  Administered 2015-06-23 – 2015-06-24 (×2): 5 mg via ORAL
  Filled 2015-06-23 (×2): qty 1

## 2015-06-23 MED ORDER — HEPARIN SODIUM (PORCINE) 5000 UNIT/ML IJ SOLN
5000.0000 [IU] | Freq: Three times a day (TID) | INTRAMUSCULAR | Status: DC
  Administered 2015-06-23 – 2015-06-25 (×7): 5000 [IU] via SUBCUTANEOUS
  Filled 2015-06-23 (×7): qty 1

## 2015-06-23 MED ORDER — METOPROLOL TARTRATE 50 MG PO TABS
50.0000 mg | ORAL_TABLET | Freq: Two times a day (BID) | ORAL | Status: DC
  Administered 2015-06-23 – 2015-06-25 (×3): 50 mg via ORAL
  Filled 2015-06-23 (×4): qty 1

## 2015-06-23 MED ORDER — ACETAMINOPHEN 325 MG PO TABS: 650.0000 mg | ORAL_TABLET | Freq: Four times a day (QID) | ORAL | Status: DC | PRN

## 2015-06-23 MED ORDER — HYDROCODONE-ACETAMINOPHEN 5-325 MG PO TABS
1.0000 | ORAL_TABLET | Freq: Three times a day (TID) | ORAL | Status: DC | PRN
Start: 2015-06-23 — End: 2015-06-25

## 2015-06-23 MED ORDER — SODIUM CHLORIDE 0.9 % IV BOLUS (SEPSIS)
1000.0000 mL | Freq: Once | INTRAVENOUS | Status: AC
  Administered 2015-06-23: 1000 mL via INTRAVENOUS

## 2015-06-23 MED ORDER — ISOSORBIDE MONONITRATE ER 30 MG PO TB24
30.0000 mg | ORAL_TABLET | Freq: Every day | ORAL | Status: DC
  Administered 2015-06-23 – 2015-06-25 (×3): 30 mg via ORAL
  Filled 2015-06-23 (×3): qty 1

## 2015-06-23 MED ORDER — DONEPEZIL HCL 5 MG PO TABS
5.0000 mg | ORAL_TABLET | Freq: Every day | ORAL | Status: DC
  Administered 2015-06-23 – 2015-06-24 (×2): 5 mg via ORAL
  Filled 2015-06-23 (×2): qty 1

## 2015-06-23 MED ORDER — DEXTROSE 5 % IV SOLN
1.0000 g | Freq: Once | INTRAVENOUS | Status: AC
  Administered 2015-06-23: 1 g via INTRAVENOUS
  Filled 2015-06-23: qty 10

## 2015-06-23 MED ORDER — ACITRETIN 25 MG PO CAPS
25.0000 mg | ORAL_CAPSULE | ORAL | Status: DC
  Administered 2015-06-24 – 2015-06-25 (×2): 25 mg via ORAL
  Filled 2015-06-23 (×3): qty 1

## 2015-06-23 MED ORDER — CALCIUM CARBONATE ANTACID 500 MG PO CHEW
500.0000 mg | CHEWABLE_TABLET | Freq: Two times a day (BID) | ORAL | Status: DC
  Administered 2015-06-23 – 2015-06-25 (×4): 500 mg via ORAL
  Filled 2015-06-23 (×4): qty 1

## 2015-06-23 MED ORDER — BOOST / RESOURCE BREEZE PO LIQD
1.0000 | Freq: Three times a day (TID) | ORAL | Status: DC
  Administered 2015-06-23: 1 via ORAL

## 2015-06-23 MED ORDER — DOCUSATE SODIUM 100 MG PO CAPS
100.0000 mg | ORAL_CAPSULE | Freq: Two times a day (BID) | ORAL | Status: DC
  Administered 2015-06-23 – 2015-06-25 (×4): 100 mg via ORAL
  Filled 2015-06-23 (×4): qty 1

## 2015-06-23 MED ORDER — ONDANSETRON HCL 4 MG/2ML IJ SOLN: 4.0000 mg | Freq: Four times a day (QID) | INTRAMUSCULAR | Status: DC | PRN

## 2015-06-23 MED ORDER — VITAMIN D 1000 UNITS PO TABS
2000.0000 [IU] | ORAL_TABLET | Freq: Every day | ORAL | Status: DC
  Administered 2015-06-23 – 2015-06-25 (×3): 2000 [IU] via ORAL
  Filled 2015-06-23 (×4): qty 2

## 2015-06-23 MED ORDER — ACETAMINOPHEN 650 MG RE SUPP: 650.0000 mg | Freq: Four times a day (QID) | RECTAL | Status: DC | PRN

## 2015-06-23 MED ORDER — VITAMIN B-12 1000 MCG PO TABS
2000.0000 ug | ORAL_TABLET | Freq: Every day | ORAL | Status: DC
  Administered 2015-06-23 – 2015-06-25 (×3): 2000 ug via ORAL
  Filled 2015-06-23 (×3): qty 2

## 2015-06-23 MED ORDER — FAMOTIDINE 20 MG PO TABS: 20.0000 mg | ORAL_TABLET | Freq: Every day | ORAL | Status: DC

## 2015-06-23 MED ORDER — SODIUM CHLORIDE 0.9 % IV SOLN
INTRAVENOUS | Status: DC
  Administered 2015-06-23: 18:00:00 via INTRAVENOUS

## 2015-06-23 MED ORDER — ONDANSETRON HCL 4 MG PO TABS: 4.0000 mg | ORAL_TABLET | Freq: Four times a day (QID) | ORAL | Status: DC | PRN

## 2015-06-23 NOTE — ED Notes (Signed)
Pt arrives via EMS from [facility], staff states pt has been withdrawn and altered this AM, states she wet the bed last night which is unusual for her, arrives with no complaints, hx of dementia

## 2015-06-23 NOTE — ED Notes (Signed)
Pt resting in bed, family at bedside, resp even and unlabored, pt in no distress

## 2015-06-23 NOTE — Care Management (Addendum)
Patient presents from [facility] [phone])  spoke with patient's daughter [name].  Patient has been a resident for 2 years and up until 3 weeks ago was ambulatory, eating and drinking.  Patient is now not eating much and not taking liquids.  She is not able to ambulate "as strongly" as she has in the past.  Is currently being followed by [facility] and PT.  [name] is concerned that the facily "may be thinking she needs too much care."  discussed patient's diagnosis of dementia which is progressive and this could cause weakness and not eating.  Also discussed that UTI could cause similar sx.  Discussed if needs higher level of care, would not be covered under medicare- would have to look at long term medicaid bed (medicaid is currently paying for stay at assisted living). Discussed if patient's facility can meet care needs and accept back, may want to consider palliative care to follow at facility and or determine whether patient meets criteria for home hospice.  Patient does have DNR. Notified [name] with [facility] of observation stay.  Spoke with [name] from [facility] and she stated that patient's care needs CAN be met and very agreeable for palliative care or hospice to follow at the facility.   this is patient's third ED visit since April 20 for related symptoms

## 2015-06-23 NOTE — Progress Notes (Signed)
Pt arrived on unit. VSS. Skin assessment was completed with Hilario QuarryZantea, RN as second verification. Cardiac monitoring placed.  Karsten RoLauren E Hobbs

## 2015-06-23 NOTE — Care Management Obs Status (Signed)
MEDICARE OBSERVATION STATUS NOTIFICATION   Patient Details  Name: [name] MRN: [id number] Date of Birth: 1921-02-15   Medicare Observation Status Notification Given:  Yes Explained in full to daughter [name] and she signed for patient   [name], Denise Kiger R, RN 06/23/2015, 6:32 PM

## 2015-06-23 NOTE — ED Notes (Signed)
pts bed cleaned and changed, new diaper placed on pt

## 2015-06-23 NOTE — ED Provider Notes (Signed)
[facility] Emergency Department Provider Note   ____________________________________________  Time seen: Approximately 11:30 AM I have reviewed the triage vital signs and the triage nursing note.  HISTORY  Chief Complaint Weakness and Altered Mental Status   Historian Limited due to dementia, patient unable to provide history Nursing home report  HPI [name] is a 82 y.o. female with dementia from a nursing home, was sent here by the nursing home for what sounds like decreased activity level from her baseline and urinated on herself over night which is unusual for her.  No reported fevers, vomiting, chest or abdominal pain, focal weakness or numbness confusion or other altered mental status. Symptoms are mild, nothing makes it worse or better.    Past Medical History  Diagnosis Date  . Old myocardial infarction   . Essential (primary) hypertension   . Hyperlipidemia   . Diabetes mellitus without complication (HCC)   . Rhabdomyolysis   . Anemia     Patient Active Problem List   Diagnosis Date Noted  . Cholecystitis 05/29/2015  . Pressure ulcer 05/29/2015  . Old myocardial infarction 08/29/2013  . Essential hypertension 08/29/2013  . Hyperlipidemia 08/29/2013    Past Surgical History  Procedure Laterality Date  . Appendectomy      Current Outpatient Rx  Name  Route  Sig  Dispense  Refill  . acitretin (SORIATANE) 25 MG capsule   Oral   Take 25 mg by mouth See admin instructions. Takes 25mg  five times weekly (Monday-Friday)         . amLODipine (NORVASC) 5 MG tablet   Oral   Take 5 mg by mouth daily.         . calcium carbonate (OS-CAL) 600 MG TABS tablet   Oral   Take 600 mg by mouth 2 (two) times daily with a meal.         . Cholecalciferol (VITAMIN D) 2000 UNITS CAPS   Oral   Take 2,000 Units by mouth daily.         Marland Kitchen. docusate sodium (COLACE) 100 MG capsule   Oral   Take 1 capsule (100 mg total) by mouth 2 (two)  times daily. Hold for > 2 bowel movements in a day   30 capsule   0   . donepezil (ARICEPT) 5 MG tablet   Oral   Take 5 mg by mouth at bedtime.          Marland Kitchen. HYDROcodone-acetaminophen (NORCO) 5-325 MG tablet   Oral   Take 1 tablet by mouth every 8 (eight) hours as needed for moderate pain or severe pain.   20 tablet   0   . isosorbide mononitrate (IMDUR) 30 MG 24 hr tablet   Oral   Take 1 tablet (30 mg total) by mouth daily.   30 tablet   2   . metoprolol (LOPRESSOR) 50 MG tablet   Oral   Take 1 tablet (50 mg total) by mouth 2 (two) times daily.   60 tablet   0   . mirtazapine (REMERON) 15 MG tablet   Oral   Take 15 mg by mouth at bedtime.          . ranitidine (ZANTAC) 150 MG tablet   Oral   Take 150 mg by mouth at bedtime.         . triamcinolone cream (KENALOG) 0.1 %   Topical   Apply 1 application topically 2 (two) times daily.         .Marland Kitchen  vitamin B-12 (CYANOCOBALAMIN) 1000 MCG tablet   Oral   Take 2,000 mcg by mouth daily.         . ondansetron (ZOFRAN ODT) 4 MG disintegrating tablet   Oral   Take 1 tablet (4 mg total) by mouth every 8 (eight) hours as needed for nausea or vomiting.   30 tablet   0   . PE-Shark Liver Oil-Cocoa Buttr (HEMORRHOID RE)   Rectal   Place 1 application rectally 4 (four) times daily as needed (hemorrhoid flare up).           Allergies Codeine and Sulfa antibiotics  Family History  Problem Relation Age of Onset  . CAD Father     Social History Social History  Substance Use Topics  . Smoking status: Never Smoker   . Smokeless tobacco: Never Used  . Alcohol Use: No    Review of Systems Limited due to dementia-- patient's responds as such, but unclear to me if patient responses are reliable  Constitutional: Eyes: Negative for visual changes. ENT: Negative for sore throat. Cardiovascular: Negative for chest pain. Respiratory: Negative for shortness of breath. Gastrointestinal: Negative for abdominal pain,  vomiting and diarrhea. Genitourinary: Negative for dysuria. Musculoskeletal: Negative for back pain. Skin: Negative for rash. Neurological: Negative for headache.  ____________________________________________   PHYSICAL EXAM:  VITAL SIGNS: ED Triage Vitals  Enc Vitals Group     BP 06/23/15 1109 146/71 mmHg     Pulse --      Resp 06/23/15 1109 18     Temp --      Temp Source 06/23/15 1109 Oral     SpO2 --      Weight 06/23/15 1109 120 lb (54.432 kg)     Height 06/23/15 1109 5\' 3"  (1.6 m)     Head Cir --      Peak Flow --      Pain Score --      Pain Loc --      Pain Edu? --      Excl. in GC? --      Constitutional: Alert and cooperative, poor historian. Well appearing and in no distress. HEENT   Head: Normocephalic and atraumatic.      Eyes: Conjunctivae are normal. PERRL. Normal extraocular movements.      Ears:         Nose: No congestion/rhinnorhea.   Mouth/Throat: Mucous membranes are mildly dry.   Neck: No stridor. Cardiovascular/Chest: Bradycardic, regular rhythm.  No murmurs, rubs, or gallops. Respiratory: Normal respiratory effort without tachypnea nor retractions. Breath sounds are clear and equal bilaterally. No wheezes/rales/rhonchi. Gastrointestinal: Soft. No distention, no guarding, no rebound. Nontender.    Genitourinary/rectal:Deferred Musculoskeletal: Nontender with normal range of motion in all extremities. No joint effusions.  No lower extremity tenderness.  No edema. Neurologic: No facial droop.  No slurred speech No gross or focal neurologic deficits are appreciated. Skin:  Skin is warm, dry and intact. No rash noted. Psychiatric: No hallucinations  ____________________________________________   EKG I, Governor Rooks, MD, the attending physician have personally viewed and interpreted all ECGs.  59 bpm.  Narrow qrs.  Left axis deviation.  Nonspecific ST and T-wave ____________________________________________  LABS (pertinent  positives/negatives)  metabolic panel significant for BUN 39 and creatinine 3.44 (baseline creatinine around 2.7 this year) White blood count 10.1, hemoglobin 13.2 and platelet count 218 Urinalysis many bacteria, too numerous to count white blood cells and 1+ leukocytes  ____________________________________________  RADIOLOGY All Xrays were viewed by me.  Imaging interpreted by Radiologist.  None __________________________________________  PROCEDURES  Procedure(s) performed: None  Critical Care performed: None  ____________________________________________   ED COURSE / ASSESSMENT AND PLAN  Pertinent labs & imaging results that were available during my care of the patient were reviewed by me and considered in my medical decision making (see chart for details).   This patient has been here a couple times for complaints of generalized weakness, and in talking with the nursing home staff as well as the daughter it seems like she has been having a decline in her ability to get around. No reported focal weakness suggestive of stroke. She did have some incontinence which raises suspicion for a urinary tract infection.  Patient has no evidence of sepsis clinically or on evaluation. Her urine is positive for urinary tract infection. Rocephin was initiated. Urine culture was sent.  Her BUN/creatinine are a little worse then known chronic kidney disease. She is given 1 L normal saline bolus here in the [facility].  I do think she warrants hospital observation for treatment of UTI, generalized weakness and acute on chronic renal failure. She is likely going to need more acute care facility than her assisted living. She likely needs to have a palliative care consult as well, as I suspect her frequent visits due to dehydration are probably dementia related.    CONSULTATIONS:  Hospitalist for admission, Dr. Clint Guy.   Patient / Family / Caregiver informed of clinical course, medical  decision-making process, and agree with plan.     ___________________________________________   FINAL CLINICAL IMPRESSION(S) / ED DIAGNOSES   Final diagnoses:  UTI (lower urinary tract infection)  Acute renal failure, unspecified acute renal failure type (HCC)  Weakness              Note: This dictation was prepared with Dragon dictation. Any transcriptional errors that result from this process are unintentional   Governor Rooks, MD 06/23/15 1500

## 2015-06-23 NOTE — H&P (Signed)
[facility] [place] at [facility]   PATIENT NAME: [name]    MR#:  [id number]  DATE OF BIRTH:  31-Jan-1922   DATE OF ADMISSION:  06/23/2015  PRIMARY CARE PHYSICIAN: Ginette Otto, MD   REQUESTING/REFERRING PHYSICIAN: Ladona Ridgel  CHIEF COMPLAINT:   Chief Complaint  Patient presents with  . Weakness  . Altered Mental Status    HISTORY OF PRESENT ILLNESS:  [name]  is a 82 y.o. female with a known history of Essential hypertension, dementia who is presenting with weakness and worsening mental status. History obtained from daughter present at bedside. States that she is called by [facility] assisted living setting that the patient was more confused and weak and urinating on herself. Thus brought to the Hospital further workup and evaluation. At baseline patient can use a walker for ambulation is mildly confused however she is worse than baseline. In emergency department noted to have urinary tract infection.  PAST MEDICAL HISTORY:   Past Medical History  Diagnosis Date  . Old myocardial infarction   . Essential (primary) hypertension   . Hyperlipidemia   . Diabetes mellitus without complication (HCC)   . Rhabdomyolysis   . Anemia     PAST SURGICAL HISTORY:   Past Surgical History  Procedure Laterality Date  . Appendectomy      SOCIAL HISTORY:   Social History  Substance Use Topics  . Smoking status: Never Smoker   . Smokeless tobacco: Never Used  . Alcohol Use: No    FAMILY HISTORY:   Family History  Problem Relation Age of Onset  . CAD Father     DRUG ALLERGIES:   Allergies  Allergen Reactions  . Codeine Other (See Comments)    Reaction: unknown  . Sulfa Antibiotics Hives    REVIEW OF SYSTEMS:  REVIEW OF SYSTEMS:  Unreliable given patient's mental status   MEDICATIONS AT HOME:   Prior to Admission medications   Medication Sig Start Date End Date Taking? Authorizing Provider  acitretin (SORIATANE) 25 MG capsule  Take 25 mg by mouth See admin instructions. Takes 25mg  five times weekly (Monday-Friday)   Yes Historical Provider, MD  amLODipine (NORVASC) 5 MG tablet Take 5 mg by mouth daily.   Yes Historical Provider, MD  calcium carbonate (OS-CAL) 600 MG TABS tablet Take 600 mg by mouth 2 (two) times daily with a meal.   Yes Historical Provider, MD  Cholecalciferol (VITAMIN D) 2000 UNITS CAPS Take 2,000 Units by mouth daily.   Yes Historical Provider, MD  docusate sodium (COLACE) 100 MG capsule Take 1 capsule (100 mg total) by mouth 2 (two) times daily. Hold for > 2 bowel movements in a day 05/31/15  Yes Enid Baas, MD  donepezil (ARICEPT) 5 MG tablet Take 5 mg by mouth at bedtime.  09/27/14  Yes Historical Provider, MD  HYDROcodone-acetaminophen (NORCO) 5-325 MG tablet Take 1 tablet by mouth every 8 (eight) hours as needed for moderate pain or severe pain. 05/31/15  Yes Enid Baas, MD  isosorbide mononitrate (IMDUR) 30 MG 24 hr tablet Take 1 tablet (30 mg total) by mouth daily. 05/31/15  Yes Enid Baas, MD  metoprolol (LOPRESSOR) 50 MG tablet Take 1 tablet (50 mg total) by mouth 2 (two) times daily. 05/31/15  Yes Enid Baas, MD  mirtazapine (REMERON) 15 MG tablet Take 15 mg by mouth at bedtime.  09/27/14  Yes Historical Provider, MD  ranitidine (ZANTAC) 150 MG tablet Take 150 mg by mouth at bedtime.  Yes Historical Provider, MD  triamcinolone cream (KENALOG) 0.1 % Apply 1 application topically 2 (two) times daily.   Yes Historical Provider, MD  vitamin B-12 (CYANOCOBALAMIN) 1000 MCG tablet Take 2,000 mcg by mouth daily.   Yes Historical Provider, MD  ondansetron (ZOFRAN ODT) 4 MG disintegrating tablet Take 1 tablet (4 mg total) by mouth every 8 (eight) hours as needed for nausea or vomiting. 05/31/15   Enid Baasadhika Kalisetti, MD  PE-Shark Liver Oil-Cocoa Buttr (HEMORRHOID RE) Place 1 application rectally 4 (four) times daily as needed (hemorrhoid flare up).    Historical Provider, MD       VITAL SIGNS:  Blood pressure 129/81, pulse 51, resp. rate 14, height 5\' 3"  (1.6 m), weight 54.432 kg (120 lb), SpO2 94 %.  PHYSICAL EXAMINATION:  VITAL SIGNS: Filed Vitals:   06/23/15 1300 06/23/15 1400  BP: 119/60 129/81  Pulse: 51   Resp: 17 14   GENERAL:82 y.o.female currently in no acute distress.  HEAD: Normocephalic, atraumatic.  EYES: Pupils equal, round, reactive to light. Extraocular muscles intact. No scleral icterus.  MOUTH: Moist mucosal membrane. Dentition intact. No abscess noted.  EAR, NOSE, THROAT: Clear without exudates. No external lesions.  NECK: Supple. No thyromegaly. No nodules. No JVD.  PULMONARY: Clear to ascultation, without wheeze rails or rhonci. No use of accessory muscles, Good respiratory effort. good air entry bilaterally CHEST: Nontender to palpation.  CARDIOVASCULAR: S1 and S2. Regular rate and rhythm. No murmurs, rubs, or gallops. No edema. Pedal pulses 2+ bilaterally.  GASTROINTESTINAL: Soft, nontender, nondistended. No masses. Positive bowel sounds. No hepatosplenomegaly.  MUSCULOSKELETAL: No swelling, clubbing, or edema. Range of motion full in all extremities.  NEUROLOGIC: Cranial nerves II through XII are intact. No gross focal neurological deficits. Sensation intact. Reflexes intact.  SKIN: No ulceration, lesions, rashes, or cyanosis. Skin warm and dry. Turgor intact.  PSYCHIATRIC: Mood, affect Flat. The patient is awake, alert and oriented self. Insight, judgment poor at this time.    LABORATORY PANEL:   CBC  Recent Labs Lab 06/23/15 1113  WBC 7.1  HGB 13.2  HCT 40.1  PLT 218   ------------------------------------------------------------------------------------------------------------------  Chemistries   Recent Labs Lab 06/23/15 1113  NA 138  K 3.8  CL 100*  CO2 30  GLUCOSE 107*  BUN 39*  CREATININE 3.44*  CALCIUM 11.9*  AST 19  ALT 9*  ALKPHOS 51  BILITOT 0.5    ------------------------------------------------------------------------------------------------------------------  Cardiac Enzymes No results for input(s): TROPONINI in the last 168 hours. ------------------------------------------------------------------------------------------------------------------  RADIOLOGY:  No results found.  EKG:   Orders placed or performed during the hospital encounter of 06/23/15  . ED EKG  . ED EKG  . ED EKG  . ED EKG  . EKG 12-Lead  . EKG 12-Lead    IMPRESSION AND PLAN:   82 year old Caucasian female history of dementia essential hypertension presenting with worsening mental status  1. Metabolic encephalopathy: Secondary urinary tract infection site unspecified: IV fluid hydration ceftriaxone follow culture data and adjust antibiotics accordingly avoid further sedating agents 2. Acute kidney injury on chronic kidney disease: Gentle IV fluid hydration We. Essential hypertension continue with Lopressor, Norvasc 4. Venous thromboembolism prophylactic: Heparin    All the records are reviewed and case discussed with ED provider. Management plans discussed with the patient, family and they are in agreement.  CODE STATUS: DO NOT RESUSCITATE  TOTAL TIME TAKING CARE OF THIS PATIENT: 33 minutes.    Areana Kosanke,  Mardi MainlandDavid K M.D on 06/23/2015 at 2:50 PM  Between 7am  to 6pm - Pager - [phone]  After 6pm: House Pager: - [phone]  [facility]  Office  [phone]  CC: Primary care physician; Ginette Otto, MD

## 2015-06-23 NOTE — ED Provider Notes (Deleted)
Medical screening examination/treatment/procedure(s) were performed by non-physician practitioner and as supervising physician I was immediately available for consultation/collaboration.    Governor Rooksebecca Deloris Moger, MD 06/23/15 [phone]

## 2015-06-24 DIAGNOSIS — N390 Urinary tract infection, site not specified: Secondary | ICD-10-CM | POA: Diagnosis not present

## 2015-06-24 LAB — BASIC METABOLIC PANEL
Anion gap: 9 (ref 5–15)
BUN: 32 mg/dL — AB (ref 6–20)
CO2: 28 mmol/L (ref 22–32)
CREATININE: 2.84 mg/dL — AB (ref 0.44–1.00)
Calcium: 10.2 mg/dL (ref 8.9–10.3)
Chloride: 102 mmol/L (ref 101–111)
GFR calc Af Amer: 16 mL/min — ABNORMAL LOW (ref >60)
GFR, EST NON AFRICAN AMERICAN: 13 mL/min — AB (ref >60)
Glucose, Bld: 85 mg/dL (ref 65–99)
Potassium: 3.7 mmol/L (ref 3.5–5.1)
SODIUM: 139 mmol/L (ref 135–145)

## 2015-06-24 LAB — CBC
HCT: 36 % (ref 35.0–47.0)
Hemoglobin: 12.1 g/dL (ref 12.0–16.0)
MCH: 28.1 pg (ref 26.0–34.0)
MCHC: 33.6 g/dL (ref 32.0–36.0)
MCV: 83.6 fL (ref 80.0–100.0)
PLATELETS: 185 10*3/uL (ref 150–440)
RBC: 4.3 MIL/uL (ref 3.80–5.20)
RDW: 14.8 % — AB (ref 11.5–14.5)
WBC: 6.3 10*3/uL (ref 3.6–11.0)

## 2015-06-24 MED ORDER — CEPHALEXIN 250 MG PO CAPS
250.0000 mg | ORAL_CAPSULE | ORAL | Status: DC
  Administered 2015-06-24: 250 mg via ORAL
  Filled 2015-06-24: qty 1

## 2015-06-24 MED ORDER — ENSURE ENLIVE PO LIQD
237.0000 mL | Freq: Two times a day (BID) | ORAL | Status: DC
  Administered 2015-06-24 – 2015-06-25 (×3): 237 mL via ORAL

## 2015-06-24 MED ORDER — DIPHENHYDRAMINE HCL 25 MG PO CAPS
25.0000 mg | ORAL_CAPSULE | Freq: Once | ORAL | Status: AC
  Administered 2015-06-24: 25 mg via ORAL
  Filled 2015-06-24: qty 1

## 2015-06-24 NOTE — Progress Notes (Signed)
Initial Nutrition Assessment  DOCUMENTATION CODES:   Not applicable  INTERVENTION:  -Monitor intake. Recommend dysphagia 3 diet secondary to limited teeth and difficulty chewing -Recommend changing boost breeze (juice type) to Ensure Enlive po BID, each supplement provides 350 kcal and 20 grams of protein as pt prefers milky supplement.  Has been drinking as outpatient    NUTRITION DIAGNOSIS:   Inadequate oral intake related to acute illness as evidenced by per patient/family report.    GOAL:   Patient will meet greater than or equal to 90% of their needs    MONITOR:   PO intake, Supplement acceptance  REASON FOR ASSESSMENT:   Malnutrition Screening Tool    ASSESSMENT:   82 y/o female admitted with UTI, altered mental status, acute kidney injury.  Past Medical History  Diagnosis Date  . Old myocardial infarction   . Essential (primary) hypertension   . Hyperlipidemia   . Diabetes mellitus without complication (HCC)   . Rhabdomyolysis   . Anemia   . Arthritis     Dtr at bedside and reports intake has decreased over the last 2-3 weeks at assisted living. Could not give any more details than that. Pt reports has been drinking ensure/boost outpatient Sitter at bedside and reports pt ate few bites of oatmeal, most of egg wheat, few sips of milk and juice.   Medications reviewed: vit D, colace, remeron, vit b12, NS at 2850ml/hr Labs reviewed: BUN 32, creatinine 2.84  Nutrition-Focused physical exam completed. Findings are no  fat depletion, normal to mild (only in temple) muscle depletion, and unable to assess edema. Noted limited activity over the last 3 weeks (pt weaker per dtr)  Pt has limited teeth and unable to chew.   Diet Order:  DIET DYS 3 Room service appropriate?: Yes; Fluid consistency:: Thin  Skin:   (noted stage II pressure ulcer on coccyx)  Last BM:  5/17  Height:   Ht Readings from Last 1 Encounters:  06/23/15 5\' 1"  (1.549 m)    Weight: Dtr  thinks wt has decreased, reports UBW between 119-125 pounds per Dtr. Noted 128 pounds floor wt.  Wt Readings from Last 1 Encounters:  06/23/15 128 lb 4.8 oz (58.196 kg)    Ideal Body Weight:     BMI:  Body mass index is 24.25 kg/(m^2).  Estimated Nutritional Needs:   Kcal:  1450-1740 kcals/d.   Protein:  72-87 g/d  Fluid:  1.4-1.7 L/d  EDUCATION NEEDS:   No education needs identified at this time  Osmar Howton B. Freida BusmanAllen, RD, LDN [phone] (pager) Weekend/On-Call pager [phone])

## 2015-06-24 NOTE — Progress Notes (Signed)
Pharmacy Note - Renal Dosing  Patient with orders for cephalexin 250mg  PO Q12H for UTI  Estimated Creatinine Clearance: 10.4 mL/min (by C-G formula based on Cr of 2.84).  Will adjust for renal function to cephalexin 250mg  PO Q24H. Will continue to follow.  Garlon HatchetJody Kellye Mizner, PharmD Clinical Pharmacist  06/24/2015 3:58 PM

## 2015-06-24 NOTE — Progress Notes (Addendum)
[facility] [place] [facility]   PATIENT NAME: Denise MenghiniMolly Neal    MR#:  [id number]  DATE OF BIRTH:  [birth year]  SUBJECTIVE:  CHIEF COMPLAINT:   Chief Complaint  Patient presents with  . Weakness  . Altered Mental Status  seems close to baseline, was agitated last evening per family.  REVIEW OF SYSTEMS:  Review of Systems  Unable to perform ROS: dementia   DRUG ALLERGIES:   Allergies  Allergen Reactions  . Codeine Other (See Comments)    Reaction: unknown  . Sulfa Antibiotics Hives   VITALS:  Blood pressure 169/70, pulse 54, temperature 98.1 F (36.7 C), temperature source Axillary, resp. rate 20, height 5\' 1"  (1.549 m), weight 58.196 kg (128 lb 4.8 oz), SpO2 96 %. PHYSICAL EXAMINATION:  Physical Exam  Constitutional: She is well-developed, well-nourished, and in no distress.  HENT:  Head: Normocephalic and atraumatic.  Eyes: Conjunctivae and EOM are normal. Pupils are equal, round, and reactive to light.  Neck: Normal range of motion. Neck supple. No tracheal deviation present. No thyromegaly present.  Cardiovascular: Normal rate, regular rhythm and normal heart sounds.   Pulmonary/Chest: Effort normal and breath sounds normal. No respiratory distress. She has no wheezes. She exhibits no tenderness.  Abdominal: Soft. Bowel sounds are normal. She exhibits no distension. There is no tenderness.  Musculoskeletal: Normal range of motion.  Neurological: She is disoriented. No cranial nerve deficit.  Pleasantly confused  Skin: Skin is warm and dry. No rash noted.  Psychiatric: She exhibits disordered thought content. She has a flat affect.   LABORATORY PANEL:   CBC  Recent Labs Lab 06/24/15 0456  WBC 6.3  HGB 12.1  HCT 36.0  PLT 185   ------------------------------------------------------------------------------------------------------------------ Chemistries   Recent Labs Lab 06/23/15 1113 06/24/15 0456  NA 138 139  K 3.8 3.7  CL  100* 102  CO2 30 28  GLUCOSE 107* 85  BUN 39* 32*  CREATININE 3.44* 2.84*  CALCIUM 11.9* 10.2  AST 19  --   ALT 9*  --   ALKPHOS 51  --   BILITOT 0.5  --    RADIOLOGY:  No results found. ASSESSMENT AND PLAN:  82 year old Caucasian female history of dementia essential hypertension presenting with worsening mental status  1. Metabolic encephalopathy: Secondary to urinary tract infection and dehydration. Improving with treatment  - switch rocephin to PO Keflex and finish 3 days course. - No fever/leukocytosis  2. Acute kidney injury on chronic kidney disease 4:  Improving with hydration - Creat 3.4 -> 2.84  3. Essential hypertension: - continue with Lopressor, Norvasc  4. Venous thromboembolism prophylactic: Heparin   D/C tele  All the records are reviewed and case discussed with Care Management/Social Worker. Management plans discussed with the patient, family (with her daughter at bedside) and they are in agreement.  CODE STATUS: DNR, Agreeable with Palliative care at facility  TOTAL TIME TAKING CARE OF THIS PATIENT: 35 minutes.   More than 50% of the time was spent in counseling/coordination of care: YES  POSSIBLE D/C IN AM, DEPENDING ON CLINICAL CONDITION.   [facility], Egor Fullilove M.D on 06/24/2015 at 2:54 PM  Between 7am to 6pm - Pager - [phone]  After 6pm go to [email or web address] - password [email or web address]  [facility]  Office  [phone]  CC: Primary care physician; Ginette OttoSTONEKING,HAL THOMAS, MD  Note: This dictation was prepared with Dragon dictation along with smaller phrase technology. Any transcriptional errors that result from this process are unintentional.

## 2015-06-24 NOTE — Progress Notes (Signed)
Called Dr. Tobi BastosPyreddy regarding an order for safety sitter and sleeping med.  Doctor put in appropriate orders.  Arturo MortonClay, Natanya Holecek N  06/24/2015  4:04 AM

## 2015-06-24 NOTE — Clinical Social Work Note (Signed)
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  Yes Need for family participation in patient care:  Yes (Comment)  Care giving concerns:  Patient resides at [facility].   Social Worker assessment / plan:  CSW received consult that patient is from [facility]. CSW met with patient's daughter, Leavy Cella, at patient's bedside this afternoon. Patient's daughter stated that she wants patient to return to [facility] as this is her home and has been there for 2 years. Patient's daughter wanted to ensure that [facility] at [facility] could manage patient though since she has declined so much. CSW informed daughter that the RN CM had contacted [facility] at [facility] and that [facility] stated that she could meet patient's needs at their facility. CSW has left [facility] a message regarding this.   [facility] called CSW back and stated that patient could return and they could meet patient's needs and that it would be fine to have Palliative Care see patient at their facility.   Employment status:  Retired Forensic scientist:  Medicare PT Recommendations:  Not assessed at this time Information / Referral to community resources:      Patient/Family's Response to care:  Patient's daughter expressed appreciation for CSW assistance.  Patient/Family's Understanding of and Emotional Response to Diagnosis, Current Treatment, and Prognosis:  Patient's daughter is in agreement with Palliative Care at facility.  Emotional Assessment Appearance:  Appears stated age Attitude/Demeanor/Rapport:   (patient was sleeping at time of assessment) Affect (typically observed):  Unable to Assess Orientation:  Fluctuating Orientation (Suspected and/or reported Sundowners) Alcohol / Substance use:  Not Applicable Psych involvement (Current and /or in the community):  No (Comment)  Discharge Needs  Concerns to be addressed:  No discharge needs identified Readmission within the last 30 days:  No Current discharge risk:  None Barriers to Discharge:  No Barriers Identified   Shela Leff, LCSW 06/24/2015, 1:13 PM

## 2015-06-25 DIAGNOSIS — N390 Urinary tract infection, site not specified: Secondary | ICD-10-CM | POA: Diagnosis not present

## 2015-06-25 LAB — BASIC METABOLIC PANEL
Anion gap: 7 (ref 5–15)
BUN: 27 mg/dL — ABNORMAL HIGH (ref 6–20)
CHLORIDE: 105 mmol/L (ref 101–111)
CO2: 25 mmol/L (ref 22–32)
Calcium: 9.7 mg/dL (ref 8.9–10.3)
Creatinine, Ser: 2.58 mg/dL — ABNORMAL HIGH (ref 0.44–1.00)
GFR calc non Af Amer: 15 mL/min — ABNORMAL LOW (ref >60)
GFR, EST AFRICAN AMERICAN: 17 mL/min — AB (ref >60)
Glucose, Bld: 116 mg/dL — ABNORMAL HIGH (ref 65–99)
POTASSIUM: 3.7 mmol/L (ref 3.5–5.1)
Sodium: 137 mmol/L (ref 135–145)

## 2015-06-25 LAB — CBC
HCT: 37.3 % (ref 35.0–47.0)
HEMOGLOBIN: 12.6 g/dL (ref 12.0–16.0)
MCH: 28.5 pg (ref 26.0–34.0)
MCHC: 33.9 g/dL (ref 32.0–36.0)
MCV: 84.1 fL (ref 80.0–100.0)
Platelets: 164 10*3/uL (ref 150–440)
RBC: 4.43 MIL/uL (ref 3.80–5.20)
RDW: 15.2 % — AB (ref 11.5–14.5)
WBC: 5.1 10*3/uL (ref 3.6–11.0)

## 2015-06-25 MED ORDER — CEPHALEXIN 250 MG PO CAPS: 250.0000 mg | ORAL_CAPSULE | ORAL | Status: DC

## 2015-06-25 MED ORDER — AMLODIPINE BESYLATE 10 MG PO TABS
10.0000 mg | ORAL_TABLET | Freq: Every day | ORAL | Status: DC
  Administered 2015-06-25: 10 mg via ORAL
  Filled 2015-06-25: qty 1

## 2015-06-25 NOTE — Plan of Care (Signed)
Problem: Education: Goal: Knowledge of [place] [facility] Education information/materials will improve Outcome: Not Progressing Patient has dementia.  Family will have to provide assistance.  Problem: Safety: Goal: Ability to remain free from injury will improve Outcome: Progressing Patient has Recruitment consultantsafety sitter.

## 2015-06-25 NOTE — Care Management (Signed)
Patient from facility. CSW facilitating discharge. RNCM signing off.  Available for discharge if needed.

## 2015-06-25 NOTE — Discharge Summary (Signed)
[facility] [place] at [facility]   PATIENT NAME: Denise MenghiniMolly Neal    MR#:  [id number]  DATE OF BIRTH:  December 19, 1921  DATE OF ADMISSION:  06/23/2015 ADMITTING PHYSICIAN: Wyatt Hasteavid K Hower, MD  DATE OF DISCHARGE: No discharge date for patient encounter.  PRIMARY CARE PHYSICIAN: Ginette OttoSTONEKING,HAL THOMAS, MD    ADMISSION DIAGNOSIS:  Weakness [R53.1] UTI (lower urinary tract infection) [N39.0] Acute renal failure, unspecified acute renal failure type (HCC) [N17.9]  DISCHARGE DIAGNOSIS:  Principal Problem:   UTI (lower urinary tract infection)   SECONDARY DIAGNOSIS:   Past Medical History  Diagnosis Date  . Old myocardial infarction   . Essential (primary) hypertension   . Hyperlipidemia   . Diabetes mellitus without complication (HCC)   . Rhabdomyolysis   . Anemia   . Arthritis     HOSPITAL COURSE:  82 year old Caucasian female history of dementia essential hypertension admitted for worsening mental status  1. Metabolic encephalopathy: Secondary to urinary tract infection and dehydration. Now close to baseline. - finished 3 days course of PO Keflex. - No fever/leukocytosis  2. Acute kidney injury on chronic kidney disease 4:  Improving with hydration - Creat 3.4 -> 2.84 -> 2.58  3. Essential hypertension: - continue with Lopressor, Norvasc  4. Acute delirium - multifacotrial - prefer patient kept at her regular living place ([facility])   Patient is being D/C back to [facility] today and palliative care to follow at the facility, may benefit from Hospice if qualifies DISCHARGE CONDITIONS:   fair  CONSULTS OBTAINED:  Treatment Team:  Wyatt Hasteavid K Hower, MD  DRUG ALLERGIES:   Allergies  Allergen Reactions  . Codeine Other (See Comments)    Reaction: unknown  . Sulfa Antibiotics Hives    DISCHARGE MEDICATIONS:   Current Discharge Medication List    START taking these medications   Details  cephALEXin (KEFLEX) 250 MG capsule Take 1  capsule (250 mg total) by mouth daily. Qty: 2 capsule, Refills: 0      CONTINUE these medications which have NOT CHANGED   Details  acitretin (SORIATANE) 25 MG capsule Take 25 mg by mouth See admin instructions. Takes 25mg  five times weekly (Monday-Friday)    amLODipine (NORVASC) 5 MG tablet Take 5 mg by mouth daily.    calcium carbonate (OS-CAL) 600 MG TABS tablet Take 600 mg by mouth 2 (two) times daily with a meal.    Cholecalciferol (VITAMIN D) 2000 UNITS CAPS Take 2,000 Units by mouth daily.    docusate sodium (COLACE) 100 MG capsule Take 1 capsule (100 mg total) by mouth 2 (two) times daily. Hold for > 2 bowel movements in a day Qty: 30 capsule, Refills: 0    donepezil (ARICEPT) 5 MG tablet Take 5 mg by mouth at bedtime.     isosorbide mononitrate (IMDUR) 30 MG 24 hr tablet Take 1 tablet (30 mg total) by mouth daily. Qty: 30 tablet, Refills: 2    metoprolol (LOPRESSOR) 50 MG tablet Take 1 tablet (50 mg total) by mouth 2 (two) times daily. Qty: 60 tablet, Refills: 0    mirtazapine (REMERON) 15 MG tablet Take 15 mg by mouth at bedtime.     ranitidine (ZANTAC) 150 MG tablet Take 150 mg by mouth at bedtime.    triamcinolone cream (KENALOG) 0.1 % Apply 1 application topically 2 (two) times daily.    vitamin B-12 (CYANOCOBALAMIN) 1000 MCG tablet Take 2,000 mcg by mouth daily.    ondansetron (ZOFRAN ODT) 4 MG disintegrating tablet Take  1 tablet (4 mg total) by mouth every 8 (eight) hours as needed for nausea or vomiting. Qty: 30 tablet, Refills: 0    PE-Shark Liver Oil-Cocoa Buttr (HEMORRHOID RE) Place 1 application rectally 4 (four) times daily as needed (hemorrhoid flare up).      STOP taking these medications     HYDROcodone-acetaminophen (NORCO) 5-325 MG tablet          DISCHARGE INSTRUCTIONS:    DIET:  Regular diet  DISCHARGE CONDITION:  Good  ACTIVITY:  Activity as tolerated  OXYGEN:  Home Oxygen: No.   Oxygen Delivery: room air  DISCHARGE  LOCATION:  nursing home - [facility] with palliative care   If you experience worsening of your admission symptoms, develop shortness of breath, life threatening emergency, suicidal or homicidal thoughts you must seek medical attention immediately by calling 911 or calling your MD immediately  if symptoms less severe.  You Must read complete instructions/literature along with all the possible adverse reactions/side effects for all the Medicines you take and that have been prescribed to you. Take any new Medicines after you have completely understood and accpet all the possible adverse reactions/side effects.   Please note  You were cared for by a hospitalist during your hospital stay. If you have any questions about your discharge medications or the care you received while you were in the hospital after you are discharged, you can call the unit and asked to speak with the hospitalist on call if the hospitalist that took care of you is not available. Once you are discharged, your primary care physician will handle any further medical issues. Please note that NO REFILLS for any discharge medications will be authorized once you are discharged, as it is imperative that you return to your primary care physician (or establish a relationship with a primary care physician if you do not have one) for your aftercare needs so that they can reassess your need for medications and monitor your lab values.    On the day of Discharge:  VITAL SIGNS:  Blood pressure 177/74, pulse 70, temperature 98.2 F (36.8 C), temperature source Oral, resp. rate 20, height  (1.549 m), weight 58.196 kg (128 lb 4.8 oz), SpO2 96 %. PHYSICAL EXAMINATION:  GENERAL:  82 y.o.-year-old patient lying in the bed with no acute distress.  EYES: Pupils equal, round, reactive to light and accommodation. No scleral icterus. Extraocular muscles intact.  HEENT: Head atraumatic, normocephalic. Oropharynx and nasopharynx  clear.  NECK:  Supple, no jugular venous distention. No thyroid enlargement, no tenderness.  LUNGS: Normal breath sounds bilaterally, no wheezing, rales,rhonchi or crepitation. No use of accessory muscles of respiration.  CARDIOVASCULAR: S1, S2 normal. No murmurs, rubs, or gallops.  ABDOMEN: Soft, non-tender, non-distended. Bowel sounds present. No organomegaly or mass.  EXTREMITIES: No pedal edema, cyanosis, or clubbing.  NEUROLOGIC: Cranial nerves II through XII are intact. Muscle strength 5/5 in all extremities. Sensation intact. Gait not checked.  PSYCHIATRIC: The patient is pleasantly confused. SKIN: No obvious rash, lesion, or ulcer.  DATA REVIEW:   CBC  Recent Labs Lab 06/25/15 0904  WBC 5.1  HGB 12.6  HCT 37.3  PLT 164    Chemistries   Recent Labs Lab 06/23/15 1113  06/25/15 0904  NA 138  < > 137  K 3.8  < > 3.7  CL 100*  < > 105  CO2 30  < > 25  GLUCOSE 107*  < > 116*  BUN 39*  < >  27*  CREATININE 3.44*  < > 2.58*  CALCIUM 11.9*  < > 9.7  AST 19  --   --   ALT 9*  --   --   ALKPHOS 51  --   --   BILITOT 0.5  --   --   < > = values in this interval not displayed.  Cardiac Enzymes No results for input(s): TROPONINI in the last 168 hours.  Microbiology Results  Results for orders placed or performed during the hospital encounter of 06/23/15  Urine culture     Status: Abnormal (Preliminary result)   Collection Time: 06/23/15 11:13 AM  Result Value Ref Range Status   Specimen Description URINE, RANDOM  Final   Special Requests NONE  Final   Culture >=100,000 COLONIES/mL ENTEROCOCCUS SPECIES (A)  Final   Report Status PENDING  Incomplete  MRSA PCR Screening     Status: None   Collection Time: 06/23/15  6:24 PM  Result Value Ref Range Status   MRSA by PCR NEGATIVE NEGATIVE Final    Comment:        The GeneXpert MRSA Assay (FDA approved for NASAL specimens only), is one component of a comprehensive MRSA colonization surveillance program. It is  not intended to diagnose MRSA infection nor to guide or monitor treatment for MRSA infections.      Management plans discussed with the patient, family and they are in agreement.  CODE STATUS:     Code Status Orders        Start     Ordered   06/23/15 1440  Do not attempt resuscitation (DNR)   Continuous    Question Answer Comment  In the event of cardiac or respiratory ARREST Do not call a "code blue"   In the event of cardiac or respiratory ARREST Do not perform Intubation, CPR, defibrillation or ACLS   In the event of cardiac or respiratory ARREST Use medication by any route, position, wound care, and other measures to relive pain and suffering. May use oxygen, suction and manual treatment of airway obstruction as needed for comfort.      06/23/15 1439    Code Status History    Date Active Date Inactive Code Status Order ID Comments User Context   05/29/2015  6:17 AM 05/31/2015  5:33 PM DNR [id number]  Arnaldo Natal, MD Inpatient    Advance Directive Documentation        Most Recent Value   Type of Advance Directive  Out of facility DNR (pink MOST or yellow form)   Pre-existing out of facility DNR order (yellow form or pink MOST form)  Yellow form placed in chart (order not valid for inpatient use)   "MOST" Form in Place?        TOTAL TIME TAKING CARE OF THIS PATIENT: 45 minutes.    [facility], Julieta Rogalski M.D on 06/25/2015 at 11:43 AM  Between 7am to 6pm - Pager - [phone]  After 6pm go to [email or web address] - password [email or web address]  [facility]  Office  [phone]  CC: Primary care physician; Ginette Otto, MD   Note: This dictation was prepared with Dragon dictation along with smaller phrase technology. Any transcriptional errors that result from this process are unintentional.

## 2015-06-25 NOTE — Progress Notes (Signed)
Pt d/c to [facility] today.  IV removed intact.  EMS called for transport.

## 2015-06-25 NOTE — NC FL2 (Signed)
[place] MEDICAID [id number] LEVEL OF CARE SCREENING TOOL     IDENTIFICATION  Patient Name: [name] Birthdate: September 08, 1921 Sex: female Admission Date (Current Location): 06/23/2015  [facility] and [place] Number:  Chiropodist and Address:  [facility], [address] [place], [place] [address]      Provider Number: [id number]  Attending Physician Name and Address:  Delfino Lovett, MD  Relative Name and Phone Number:       Current Level of Care: Hospital Recommended Level of Care: Assisted Living Facility Prior Approval Number:    Date Approved/Denied:   PASRR Number:    Discharge Plan:  (ALF)    Current Diagnoses: Patient Active Problem List   Diagnosis Date Noted  . UTI (lower urinary tract infection) 06/23/2015  . Cholecystitis 05/29/2015  . Pressure ulcer 05/29/2015  . Old myocardial infarction 08/29/2013  . Essential hypertension 08/29/2013  . Hyperlipidemia 08/29/2013    Orientation RESPIRATION BLADDER Height & Weight     Self  Normal Incontinent Weight: 128 lb 4.8 oz (58.196 kg) Height:   (154.9 cm)  BEHAVIORAL SYMPTOMS/MOOD NEUROLOGICAL BOWEL NUTRITION STATUS   (none)  (none) Incontinent Diet (dysphagia 3)  AMBULATORY STATUS COMMUNICATION OF NEEDS Skin   Total Care Verbally Normal                       Personal Care Assistance Level of Assistance  Total care           Functional Limitations Info             SPECIAL CARE FACTORS FREQUENCY   (Palliative Care To Follow)                    Contractures Contractures Info: Not present    Additional Factors Info  Code Status Code Status Info: DNR              DISCHARGE MEDICATIONS:   Current Discharge Medication List    START taking these medications   Details  cephALEXin (KEFLEX) 250 MG capsule Take 1 capsule (250 mg total) by mouth daily. Qty: 2 capsule, Refills: 0      CONTINUE these medications which have NOT CHANGED    Details  acitretin (SORIATANE) 25 MG capsule Take 25 mg by mouth See admin instructions. Takes  five times weekly (Monday-Friday)    amLODipine (NORVASC) 5 MG tablet Take 5 mg by mouth daily.    calcium carbonate (OS-CAL) 600 MG TABS tablet Take 600 mg by mouth 2 (two) times daily with a meal.    Cholecalciferol (VITAMIN D) 2000 UNITS CAPS Take 2,000 Units by mouth daily.    docusate sodium (COLACE) 100 MG capsule Take 1 capsule (100 mg total) by mouth 2 (two) times daily. Hold for > 2 bowel movements in a day Qty: 30 capsule, Refills: 0    donepezil (ARICEPT) 5 MG tablet Take 5 mg by mouth at bedtime.     isosorbide mononitrate (IMDUR) 30 MG 24 hr tablet Take 1 tablet (30 mg total) by mouth daily. Qty: 30 tablet, Refills: 2    metoprolol (LOPRESSOR) 50 MG tablet Take 1 tablet (50 mg total) by mouth 2 (two) times daily. Qty: 60 tablet, Refills: 0    mirtazapine (REMERON) 15 MG tablet Take 15 mg by mouth at bedtime.     ranitidine (ZANTAC) 150 MG tablet Take 150 mg by mouth at bedtime.    triamcinolone cream (KENALOG) 0.1 % Apply  1 application topically 2 (two) times daily.    vitamin B-12 (CYANOCOBALAMIN) 1000 MCG tablet Take 2,000 mcg by mouth daily.    ondansetron (ZOFRAN ODT) 4 MG disintegrating tablet Take 1 tablet (4 mg total) by mouth every 8 (eight) hours as needed for nausea or vomiting. Qty: 30 tablet, Refills: 0    PE-Shark Liver Oil-Cocoa Buttr (HEMORRHOID RE) Place 1 application rectally 4 (four) times daily as needed (hemorrhoid flare up).      STOP taking these medications     HYDROcodone-acetaminophen (NORCO) 5-325 MG tablet         Discharge Medications: Please see discharge summary for a list of discharge medications.  Relevant Imaging Results:  Relevant Lab Results:   Additional Information    Denise SpanielMonica Korin Setzler, LCSW

## 2015-06-25 NOTE — Discharge Instructions (Signed)

## 2015-06-26 LAB — URINE CULTURE: Culture: >=100000 — AB

## 2015-07-11 ENCOUNTER — Inpatient Hospital Stay
Admission: EM | Admit: 2015-07-11 | Discharge: 2015-07-14 | DRG: 683 | Disposition: A | Discharge status: Skilled Nursing Facility | Payer: Medicare Other | Attending: Internal Medicine | Admitting: Internal Medicine

## 2015-07-11 ENCOUNTER — Emergency Department: Payer: Medicare Other

## 2015-07-11 DIAGNOSIS — E785 Hyperlipidemia, unspecified: Secondary | ICD-10-CM | POA: Diagnosis present

## 2015-07-11 DIAGNOSIS — N179 Acute kidney failure, unspecified: Secondary | ICD-10-CM | POA: Diagnosis not present

## 2015-07-11 DIAGNOSIS — Z809 Family history of malignant neoplasm, unspecified: Secondary | ICD-10-CM

## 2015-07-11 DIAGNOSIS — Z66 Do not resuscitate: Secondary | ICD-10-CM | POA: Diagnosis present

## 2015-07-11 DIAGNOSIS — E46 Unspecified protein-calorie malnutrition: Secondary | ICD-10-CM | POA: Diagnosis present

## 2015-07-11 DIAGNOSIS — F0390 Unspecified dementia without behavioral disturbance: Secondary | ICD-10-CM | POA: Diagnosis present

## 2015-07-11 DIAGNOSIS — Z9889 Other specified postprocedural states: Secondary | ICD-10-CM | POA: Diagnosis not present

## 2015-07-11 DIAGNOSIS — W1830XA Fall on same level, unspecified, initial encounter: Secondary | ICD-10-CM | POA: Diagnosis present

## 2015-07-11 DIAGNOSIS — E860 Dehydration: Secondary | ICD-10-CM | POA: Diagnosis present

## 2015-07-11 DIAGNOSIS — Z8249 Family history of ischemic heart disease and other diseases of the circulatory system: Secondary | ICD-10-CM | POA: Diagnosis not present

## 2015-07-11 DIAGNOSIS — Z79899 Other long term (current) drug therapy: Secondary | ICD-10-CM | POA: Diagnosis not present

## 2015-07-11 DIAGNOSIS — Z886 Allergy status to analgesic agent status: Secondary | ICD-10-CM | POA: Diagnosis not present

## 2015-07-11 DIAGNOSIS — E1122 Type 2 diabetes mellitus with diabetic chronic kidney disease: Secondary | ICD-10-CM | POA: Diagnosis present

## 2015-07-11 DIAGNOSIS — W19XXXA Unspecified fall, initial encounter: Secondary | ICD-10-CM

## 2015-07-11 DIAGNOSIS — I252 Old myocardial infarction: Secondary | ICD-10-CM

## 2015-07-11 DIAGNOSIS — I2510 Atherosclerotic heart disease of native coronary artery without angina pectoris: Secondary | ICD-10-CM | POA: Diagnosis present

## 2015-07-11 DIAGNOSIS — M1990 Unspecified osteoarthritis, unspecified site: Secondary | ICD-10-CM | POA: Diagnosis present

## 2015-07-11 DIAGNOSIS — Z882 Allergy status to sulfonamides status: Secondary | ICD-10-CM

## 2015-07-11 DIAGNOSIS — Z9049 Acquired absence of other specified parts of digestive tract: Secondary | ICD-10-CM | POA: Diagnosis not present

## 2015-07-11 DIAGNOSIS — R627 Adult failure to thrive: Secondary | ICD-10-CM | POA: Diagnosis present

## 2015-07-11 DIAGNOSIS — N184 Chronic kidney disease, stage 4 (severe): Secondary | ICD-10-CM | POA: Diagnosis present

## 2015-07-11 DIAGNOSIS — I248 Other forms of acute ischemic heart disease: Secondary | ICD-10-CM | POA: Diagnosis present

## 2015-07-11 DIAGNOSIS — I129 Hypertensive chronic kidney disease with stage 1 through stage 4 chronic kidney disease, or unspecified chronic kidney disease: Secondary | ICD-10-CM | POA: Diagnosis present

## 2015-07-11 DIAGNOSIS — Z96641 Presence of right artificial hip joint: Secondary | ICD-10-CM | POA: Diagnosis present

## 2015-07-11 HISTORY — DX: Unspecified dementia, unspecified severity, without behavioral disturbance, psychotic disturbance, mood disturbance, and anxiety: F03.90

## 2015-07-11 LAB — URINALYSIS COMPLETE WITH MICROSCOPIC (ARMC ONLY)
BACTERIA UA: NONE SEEN
BILIRUBIN URINE: NEGATIVE
GLUCOSE, UA: NEGATIVE mg/dL
Hgb urine dipstick: NEGATIVE
Ketones, ur: NEGATIVE mg/dL
Leukocytes, UA: NEGATIVE
NITRITE: NEGATIVE
Protein, ur: 100 mg/dL — AB
Specific Gravity, Urine: 1.014 (ref 1.005–1.030)
pH: 6 (ref 5.0–8.0)

## 2015-07-11 LAB — CBC WITH DIFFERENTIAL/PLATELET
Basophils Absolute: 0 10*3/uL (ref 0–0.1)
Eosinophils Absolute: 0.3 10*3/uL (ref 0–0.7)
Eosinophils Relative: 3 %
HEMATOCRIT: 37.7 % (ref 35.0–47.0)
HEMOGLOBIN: 12.7 g/dL (ref 12.0–16.0)
LYMPHS ABS: 1.7 10*3/uL (ref 1.0–3.6)
Lymphocytes Relative: 18 %
MCH: 28.5 pg (ref 26.0–34.0)
MCHC: 33.6 g/dL (ref 32.0–36.0)
MCV: 85 fL (ref 80.0–100.0)
MONO ABS: 0.8 10*3/uL (ref 0.2–0.9)
NEUTROS ABS: 6.5 10*3/uL (ref 1.4–6.5)
Platelets: 180 10*3/uL (ref 150–440)
RBC: 4.44 MIL/uL (ref 3.80–5.20)
RDW: 15.7 % — AB (ref 11.5–14.5)
WBC: 9.3 10*3/uL (ref 3.6–11.0)

## 2015-07-11 LAB — GLUCOSE, CAPILLARY: GLUCOSE-CAPILLARY: 128 mg/dL — AB (ref 65–99)

## 2015-07-11 LAB — COMPREHENSIVE METABOLIC PANEL
ALBUMIN: 3.6 g/dL (ref 3.5–5.0)
ALK PHOS: 44 U/L (ref 38–126)
ALT: 10 U/L — AB (ref 14–54)
AST: 19 U/L (ref 15–41)
Anion gap: 8 (ref 5–15)
BUN: 55 mg/dL — AB (ref 6–20)
CALCIUM: 12.6 mg/dL — AB (ref 8.9–10.3)
CO2: 28 mmol/L (ref 22–32)
CREATININE: 3.53 mg/dL — AB (ref 0.44–1.00)
Chloride: 103 mmol/L (ref 101–111)
GFR calc non Af Amer: 10 mL/min — ABNORMAL LOW (ref >60)
GFR, EST AFRICAN AMERICAN: 12 mL/min — AB (ref >60)
GLUCOSE: 115 mg/dL — AB (ref 65–99)
POTASSIUM: 4.5 mmol/L (ref 3.5–5.1)
SODIUM: 139 mmol/L (ref 135–145)
TOTAL PROTEIN: 6.5 g/dL (ref 6.5–8.1)
Total Bilirubin: 0.4 mg/dL (ref 0.3–1.2)

## 2015-07-11 LAB — TROPONIN I
TROPONIN I: 0.06 ng/mL — AB (ref <0.031)
Troponin I: 0.06 ng/mL — ABNORMAL HIGH (ref <0.031)
Troponin I: 0.05 ng/mL — ABNORMAL HIGH (ref <0.031)

## 2015-07-11 MED ORDER — ACITRETIN 25 MG PO CAPS: 25.0000 mg | ORAL_CAPSULE | ORAL | Status: DC

## 2015-07-11 MED ORDER — ENSURE ENLIVE PO LIQD
237.0000 mL | Freq: Three times a day (TID) | ORAL | Status: DC
  Administered 2015-07-11 – 2015-07-14 (×7): 237 mL via ORAL

## 2015-07-11 MED ORDER — SODIUM CHLORIDE 0.9 % IV SOLN
INTRAVENOUS | Status: AC
  Administered 2015-07-11 – 2015-07-12 (×2): via INTRAVENOUS

## 2015-07-11 MED ORDER — MIRTAZAPINE 15 MG PO TABS
15.0000 mg | ORAL_TABLET | Freq: Every day | ORAL | Status: DC
  Administered 2015-07-11 – 2015-07-13 (×3): 15 mg via ORAL
  Filled 2015-07-11 (×3): qty 1

## 2015-07-11 MED ORDER — VITAMIN B-12 1000 MCG PO TABS
2000.0000 ug | ORAL_TABLET | Freq: Every day | ORAL | Status: DC
  Administered 2015-07-12 – 2015-07-14 (×3): 2000 ug via ORAL
  Filled 2015-07-11 (×3): qty 2

## 2015-07-11 MED ORDER — ACETAMINOPHEN 325 MG PO TABS: 650.0000 mg | ORAL_TABLET | Freq: Four times a day (QID) | ORAL | Status: DC | PRN

## 2015-07-11 MED ORDER — AMLODIPINE BESYLATE 5 MG PO TABS
5.0000 mg | ORAL_TABLET | Freq: Every day | ORAL | Status: DC
  Administered 2015-07-12: 5 mg via ORAL
  Filled 2015-07-11: qty 1

## 2015-07-11 MED ORDER — ENOXAPARIN SODIUM 40 MG/0.4ML ~~LOC~~ SOLN: 40.0000 mg | SUBCUTANEOUS | Status: DC

## 2015-07-11 MED ORDER — ISOSORBIDE MONONITRATE ER 30 MG PO TB24
30.0000 mg | ORAL_TABLET | Freq: Every day | ORAL | Status: DC
  Administered 2015-07-12 – 2015-07-14 (×3): 30 mg via ORAL
  Filled 2015-07-11 (×4): qty 1

## 2015-07-11 MED ORDER — ONDANSETRON HCL 4 MG/2ML IJ SOLN: 4.0000 mg | Freq: Four times a day (QID) | INTRAMUSCULAR | Status: DC | PRN

## 2015-07-11 MED ORDER — METOPROLOL TARTRATE 50 MG PO TABS
50.0000 mg | ORAL_TABLET | Freq: Two times a day (BID) | ORAL | Status: DC
  Administered 2015-07-11 – 2015-07-14 (×6): 50 mg via ORAL
  Filled 2015-07-11 (×6): qty 1

## 2015-07-11 MED ORDER — CALCIUM CARBONATE 600 MG PO TABS: 600.0000 mg | ORAL_TABLET | Freq: Two times a day (BID) | ORAL | Status: DC

## 2015-07-11 MED ORDER — DONEPEZIL HCL 5 MG PO TABS
5.0000 mg | ORAL_TABLET | Freq: Every day | ORAL | Status: DC
  Administered 2015-07-11 – 2015-07-13 (×3): 5 mg via ORAL
  Filled 2015-07-11 (×3): qty 1

## 2015-07-11 MED ORDER — CALCIUM CARBONATE ANTACID 500 MG PO CHEW
600.0000 mg | CHEWABLE_TABLET | Freq: Two times a day (BID) | ORAL | Status: DC
  Administered 2015-07-12 – 2015-07-14 (×4): 600 mg via ORAL
  Filled 2015-07-11 (×4): qty 3

## 2015-07-11 MED ORDER — ACETAMINOPHEN 650 MG RE SUPP: 650.0000 mg | Freq: Four times a day (QID) | RECTAL | Status: DC | PRN

## 2015-07-11 MED ORDER — SODIUM CHLORIDE 0.9 % IV BOLUS (SEPSIS)
1000.0000 mL | Freq: Once | INTRAVENOUS | Status: AC
Start: 2015-07-11 — End: 2015-07-11
  Administered 2015-07-11: 1000 mL via INTRAVENOUS

## 2015-07-11 MED ORDER — VITAMIN D 1000 UNITS PO TABS
2000.0000 [IU] | ORAL_TABLET | Freq: Every day | ORAL | Status: DC
  Administered 2015-07-12 – 2015-07-14 (×3): 2000 [IU] via ORAL
  Filled 2015-07-11 (×3): qty 2

## 2015-07-11 MED ORDER — HEPARIN SODIUM (PORCINE) 5000 UNIT/ML IJ SOLN
5000.0000 [IU] | Freq: Two times a day (BID) | INTRAMUSCULAR | Status: DC
  Administered 2015-07-11 – 2015-07-14 (×6): 5000 [IU] via SUBCUTANEOUS
  Filled 2015-07-11 (×6): qty 1

## 2015-07-11 MED ORDER — ONDANSETRON HCL 4 MG PO TABS: 4.0000 mg | ORAL_TABLET | Freq: Four times a day (QID) | ORAL | Status: DC | PRN

## 2015-07-11 MED ORDER — FAMOTIDINE 20 MG PO TABS
20.0000 mg | ORAL_TABLET | Freq: Every day | ORAL | Status: DC
  Administered 2015-07-11 – 2015-07-13 (×3): 20 mg via ORAL
  Filled 2015-07-11 (×3): qty 1

## 2015-07-11 MED ORDER — DOCUSATE SODIUM 100 MG PO CAPS
100.0000 mg | ORAL_CAPSULE | Freq: Two times a day (BID) | ORAL | Status: DC
  Administered 2015-07-11 – 2015-07-14 (×6): 100 mg via ORAL
  Filled 2015-07-11 (×6): qty 1

## 2015-07-11 NOTE — Progress Notes (Signed)
Order for enoxaparin 40 mg subcutaneously daily was changed to heparin 5000 units subcutaneously q12h for DVT prophylaxis per anticoagulation protocol for CrCl < 15 mL/min.  Cindi CarbonMary M Romond Pipkins, PharmD 5:44 PM

## 2015-07-11 NOTE — ED Provider Notes (Signed)
[name] Emergency Department Provider Note   ____________________________________________  Time seen: Approximately 11:33 AM  I have reviewed the triage vital signs and the nursing notes.   HISTORY  Chief Complaint Failure To Thrive and Fall  Caveat-history of present illness review systems Limited due to the patient's dementia. All information is obtained from EMS on arrival as well as the patient's daughter.  HPI [name] is a 82 y.o. female history of dementia, hypertension, hyperlipidemia, diabetes who presents from [facility] for evaluation of a fall that occurred suddenly just prior to arrival. According to her daughter, the patient was in a room and when staff saw her "either slide off the chair or she sat down and missed the chair completely". She did not lose consciousness. Daughter reports that she has had poor appetite over the past several weeks and generalized weakness but no recent illness, no vomiting, diarrhea, fevers, chills, coughing sneezing or runny nose.   Past Medical History  Diagnosis Date  . Old myocardial infarction   . Essential (primary) hypertension   . Hyperlipidemia   . Diabetes mellitus without complication (HCC)   . Rhabdomyolysis   . Anemia   . Arthritis     Patient Active Problem List   Diagnosis Date Noted  . ARF (acute renal failure) (HCC) 07/11/2015  . UTI (lower urinary tract infection) 06/23/2015  . Cholecystitis 05/29/2015  . Pressure ulcer 05/29/2015  . Old myocardial infarction 08/29/2013  . Essential hypertension 08/29/2013  . Hyperlipidemia 08/29/2013    Past Surgical History  Procedure Laterality Date  . Appendectomy    . Left arm surgery- bone grafts and 3 plates Left 1610  . Broke femur  2015  . 2000- hip replacement      Current Outpatient Rx  Name  Route  Sig  Dispense  Refill  . acitretin (SORIATANE) 25 MG capsule   Oral   Take 25 mg by mouth See admin  instructions. Takes 25mg  five times weekly (Monday-Friday)         . amLODipine (NORVASC) 5 MG tablet   Oral   Take 5 mg by mouth daily.         . calcium carbonate (OS-CAL) 600 MG TABS tablet   Oral   Take 600 mg by mouth 2 (two) times daily with a meal.         . Cholecalciferol (VITAMIN D) 2000 UNITS CAPS   Oral   Take 2,000 Units by mouth daily.         Marland Kitchen docusate sodium (COLACE) 100 MG capsule   Oral   Take 1 capsule (100 mg total) by mouth 2 (two) times daily. Hold for > 2 bowel movements in a day   30 capsule   0   . donepezil (ARICEPT) 5 MG tablet   Oral   Take 5 mg by mouth at bedtime.          . isosorbide mononitrate (IMDUR) 30 MG 24 hr tablet   Oral   Take 1 tablet (30 mg total) by mouth daily.   30 tablet   2   . metoprolol (LOPRESSOR) 50 MG tablet   Oral   Take 1 tablet (50 mg total) by mouth 2 (two) times daily.   60 tablet   0   . mirtazapine (REMERON) 15 MG tablet   Oral   Take 15 mg by mouth at bedtime.          . ondansetron [facility]  ODT) 4 MG disintegrating tablet   Oral   Take 1 tablet (4 mg total) by mouth every 8 (eight) hours as needed for nausea or vomiting.   30 tablet   0   . ranitidine (ZANTAC) 150 MG tablet   Oral   Take 150 mg by mouth at bedtime.         . shark liver oil-cocoa butter (PREPARATION H) 0.25-3-85.5 % suppository   Rectal   Place 1 suppository rectally 4 (four) times daily as needed for hemorrhoids.         . triamcinolone cream (KENALOG) 0.1 %   Topical   Apply 1 application topically 2 (two) times daily.         . vitamin B-12 (CYANOCOBALAMIN) 1000 MCG tablet   Oral   Take 2,000 mcg by mouth daily.         . cephALEXin (KEFLEX) 250 MG capsule   Oral   Take 1 capsule (250 mg total) by mouth daily.   2 capsule   0     Allergies Codeine and Sulfa antibiotics  Family History  Problem Relation Age of Onset  . CAD Father     Social History Social History  Substance Use Topics  .  Smoking status: Never Smoker   . Smokeless tobacco: Never Used  . Alcohol Use: No    Review of Systems Constitutional: No fever/chills Eyes: No visual changes. ENT: No sore throat. Cardiovascular: Denies chest pain. Respiratory: Denies shortness of breath. Gastrointestinal: No abdominal pain.  No nausea, no vomiting.  No diarrhea.  No constipation. Genitourinary: Negative for dysuria. Musculoskeletal: Negative for back pain. Skin: Negative for rash. Neurological: Negative for headaches, focal weakness or numbness.  10-point ROS otherwise negative.  ____________________________________________   PHYSICAL EXAM:  Filed Vitals:   07/11/15 1330 07/11/15 1345 07/11/15 1400 07/11/15 1415  BP: 147/70 150/66 127/76 143/64  Pulse: 61 60 58 61  Temp:      TempSrc:      Resp: 18     Height:      Weight:      SpO2: 98% 97% 95% 97%      Constitutional: Alert and oriented to self and place but not to year. Nontoxic appearing and in no acute distress. Eyes: Conjunctivae are normal. PERRL. EOMI. Head: Atraumatic. Nose: No congestion/rhinnorhea. Mouth/Throat: Mucous membranes are moist.  Oropharynx non-erythematous. Neck: No stridor.  No cervical spine tenderness to palpation. Cardiovascular: Normal rate, regular rhythm. Grossly normal heart sounds.  Good peripheral circulation. Respiratory: Normal respiratory effort.  No retractions. Lungs CTAB. Gastrointestinal: Soft and nontender. No distention. No CVA tenderness. Genitourinary: deferred Musculoskeletal: No lower extremity tenderness nor edema.  No joint effusions. Neurologic:  Normal speech and language. No gross focal neurologic deficits are appreciated. 5 out of 5 strength bilateral upper and lower extremities, sensation intact to light touch throughout. Skin:  Skin is warm, dry and intact. No rash noted. Psychiatric: Mood and affect are normal. Speech and behavior are normal.  ____________________________________________     LABS (all labs ordered are listed, but only abnormal results are displayed)  Labs Reviewed  CBC WITH DIFFERENTIAL/PLATELET - Abnormal; Notable for the following:    RDW 15.7 (*)    All other components within normal limits  URINALYSIS COMPLETEWITH MICROSCOPIC ([facility] ONLY) - Abnormal; Notable for the following:    Color, Urine YELLOW (*)    APPearance CLEAR (*)    Protein, ur 100 (*)    Squamous Epithelial / LPF 0-5 (*)  All other components within normal limits  TROPONIN I - Abnormal; Notable for the following:    Troponin I 0.06 (*)    All other components within normal limits  COMPREHENSIVE METABOLIC PANEL - Abnormal; Notable for the following:    Glucose, Bld 115 (*)    BUN 55 (*)    Creatinine, Ser 3.53 (*)    Calcium 12.6 (*)    ALT 10 (*)    GFR calc non Af Amer 10 (*)    GFR calc Af Amer 12 (*)    All other components within normal limits  TROPONIN I  TROPONIN I  TROPONIN I   ____________________________________________  EKG  ED ECG REPORT I, Gayla DossGayle, Aashish Hamm A, the attending physician, personally viewed and interpreted this ECG.   Date: 07/11/2015  EKG Time: 15:30  Rate: 63  Rhythm: normal EKG, normal sinus rhythm, normal sinus rhythm  Axis: normal  Intervals:none  ST&T Change: No acute ST elevation.  ____________________________________________  RADIOLOGY  CT head IMPRESSION: 1. Small vessel ischemic change and brain atrophy. 2. No acute intracranial abnormalities identified.  CXR IMPRESSION: No active disease. ____________________________________________   PROCEDURES  Procedure(s) performed: None  Critical Care performed: No  ____________________________________________   INITIAL IMPRESSION / ASSESSMENT AND PLAN / ED COURSE  Pertinent labs & imaging results that were available during my care of the patient were reviewed by me and considered in my medical decision making (see chart for details).  Rockne MenghiniMolly Celestine is a 82 y.o. female history  of dementia, hypertension, hyperlipidemia, diabetes who presents from [facility] assisted [facility] for evaluation of a fall that occurred suddenly just prior to arrival. She also had poor appetite and generalized weakness over the past few weeks. On exam, she is nontoxic appearing and in no acute distress, she has an intact neurological exam.Imaging negative for any acute pathology. CBC unremarkable. Troponin is mildly elevated at 0.06 but this is chronic. Urinalysis consistent with infection. CMP with creatinine elevation at 3.53 which is significantly increased from the patient's baseline, we'll admit for IV fluids. Case discussed with hospitalist for admission at 3:35 PM. ____________________________________________   FINAL CLINICAL IMPRESSION(S) / ED DIAGNOSES  Final diagnoses:  Fall, initial encounter  Dehydration      NEW MEDICATIONS STARTED DURING THIS VISIT:  New Prescriptions   No medications on file     Note:  This document was prepared using Dragon voice recognition software and may include unintentional dictation errors.    Gayla DossEryka A Helaman Mecca, MD 07/11/15 1539

## 2015-07-11 NOTE — ED Notes (Signed)
Pt is here [facility] via EMS with c/o FFT, and sliding out of her chair this morning.. Pt denies any pain, no visible injury on arrival..

## 2015-07-11 NOTE — H&P (Signed)
[facility] [place] at [facility]   PATIENT NAME: [name]    MR#:  [id number]  DATE OF BIRTH:  14-Apr-1921  DATE OF ADMISSION:  07/11/2015  PRIMARY CARE PHYSICIAN: Ginette Otto, MD   REQUESTING/REFERRING PHYSICIAN: Dr. Toney Rakes  CHIEF COMPLAINT:   Chief Complaint  Patient presents with  . Failure To Thrive  . Fall    HISTORY OF PRESENT ILLNESS:  Gina Leblond  is a 82 y.o. female with a known history of dementia, CAD, CKD with baseline creatinine of 2.5, DM, anemia from assisted living facility admitted with weakness and fall. Patient ambulates with a walker at baseline, very slow active walking according to daughter. Due to her dementia, patient unable to provide any history and most of the history is obtained from daughter at bedside. Patient was trying to sit in a chair and missed the chair today and fell to the floor. Denies any pain. No fractures noted. Labs indicate dehydration and acute on chronic renal failure. Also troponin is mildly elevated at 0.06. 2 weeks ago admitted with urinary tract infection. Urine now is clear of any bacteria. No other symptoms according to daughter.  PAST MEDICAL HISTORY:   Past Medical History  Diagnosis Date  . Old myocardial infarction   . Essential (primary) hypertension   . Hyperlipidemia   . Diabetes mellitus without complication (HCC)   . Rhabdomyolysis   . Anemia   . Arthritis   . Dementia     PAST SURGICAL HISTORY:   Past Surgical History  Procedure Laterality Date  . Appendectomy    . Right arm surgery- bone grafts and 3 plates Left 4098  . Broke femur  2015    right  . 2000- hip replacement    . Cesarean section    . Left hip surgery      SOCIAL HISTORY:   Social History  Substance Use Topics  . Smoking status: Never Smoker   . Smokeless tobacco: Never Used  . Alcohol Use: No    FAMILY HISTORY:   Family History  Problem Relation Age of Onset  . CAD Father   .  Prostate cancer Brother     DRUG ALLERGIES:   Allergies  Allergen Reactions  . Codeine Other (See Comments)    Reaction: unknown  . Sulfa Antibiotics Hives    REVIEW OF SYSTEMS:   Review of Systems  Unable to perform ROS: dementia    MEDICATIONS AT HOME:   Prior to Admission medications   Medication Sig Start Date End Date Taking? Authorizing Provider  acitretin (SORIATANE) 25 MG capsule Take 25 mg by mouth See admin instructions. Takes  five times weekly (Monday-Friday)   Yes Historical Provider, MD  amLODipine (NORVASC) 5 MG tablet Take 5 mg by mouth daily.   Yes Historical Provider, MD  calcium carbonate (OS-CAL) 600 MG TABS tablet Take 600 mg by mouth 2 (two) times daily with a meal.   Yes Historical Provider, MD  Cholecalciferol (VITAMIN D) 2000 UNITS CAPS Take 2,000 Units by mouth daily.   Yes Historical Provider, MD  docusate sodium (COLACE) 100 MG capsule Take 1 capsule (100 mg total) by mouth 2 (two) times daily. Hold for > 2 bowel movements in a day 05/31/15  Yes Enid Baas, MD  donepezil (ARICEPT) 5 MG tablet Take 5 mg by mouth at bedtime.  09/27/14  Yes Historical Provider, MD  isosorbide mononitrate (IMDUR) 30 MG 24 hr tablet Take 1 tablet (30 mg total) by  mouth daily. 05/31/15  Yes Enid Baasadhika Karenann Mcgrory, MD  metoprolol (LOPRESSOR) 50 MG tablet Take 1 tablet (50 mg total) by mouth 2 (two) times daily. 05/31/15  Yes Enid Baasadhika Jaleigha Deane, MD  mirtazapine (REMERON) 15 MG tablet Take 15 mg by mouth at bedtime.  09/27/14  Yes Historical Provider, MD  ondansetron (ZOFRAN ODT) 4 MG disintegrating tablet Take 1 tablet (4 mg total) by mouth every 8 (eight) hours as needed for nausea or vomiting. 05/31/15  Yes Enid Baasadhika Javaris Wigington, MD  ranitidine (ZANTAC) 150 MG tablet Take 150 mg by mouth at bedtime.   Yes Historical Provider, MD  shark liver oil-cocoa butter (PREPARATION H) 0.25-3-85.5 % suppository Place 1 suppository rectally 4 (four) times daily as needed for hemorrhoids.   Yes  Historical Provider, MD  triamcinolone cream (KENALOG) 0.1 % Apply 1 application topically 2 (two) times daily.   Yes Historical Provider, MD  vitamin B-12 (CYANOCOBALAMIN) 1000 MCG tablet Take 2,000 mcg by mouth daily.   Yes Historical Provider, MD  cephALEXin (KEFLEX) 250 MG capsule Take 1 capsule (250 mg total) by mouth daily. 06/25/15   Delfino LovettVipul Shah, MD      VITAL SIGNS:  Blood pressure 143/64, pulse 61, temperature 98.3 F (36.8 C), temperature source Oral, resp. rate 18, height 5' (1.524 m), weight 58.06 kg (128 lb), SpO2 97 %.  PHYSICAL EXAMINATION:   Physical Exam  GENERAL:  82 y.o.-year-old patient lying in the bed with no acute distress.  EYES: Pupils equal, round, reactive to light and accommodation. Lower lid conjunctiva is erythematous. No scleral icterus. Extraocular muscles intact.  HEENT: Head atraumatic, normocephalic. Oropharynx and nasopharynx clear. Dry mucus membranes. NECK:  Supple, no jugular venous distention. No thyroid enlargement, no tenderness.  LUNGS: Normal breath sounds bilaterally, no wheezing, rales,rhonchi or crepitation. No use of accessory muscles of respiration. Decreased bibasilar breath sounds. CARDIOVASCULAR: S1, S2 normal. No rubs, or gallops. 3/6 systolic murmur is present ABDOMEN: Soft, nontender, nondistended. Bowel sounds present. No organomegaly or mass.  EXTREMITIES: No pedal edema, cyanosis, or clubbing.  NEUROLOGIC: Cranial nerves II through XII are intact. Muscle strength 3/5 in all extremities. Global weakness noted. Following commands. Sensation intact. Gait not checked.  PSYCHIATRIC: The patient is alert and oriented to self. Not very verbal at baseline. SKIN: No obvious rash, lesion, or ulcer.   LABORATORY PANEL:   CBC  Recent Labs Lab 07/11/15 1146  WBC 9.3  HGB 12.7  HCT 37.7  PLT 180   ------------------------------------------------------------------------------------------------------------------  Chemistries   Recent  Labs Lab 07/11/15 1502  NA 139  K 4.5  CL 103  CO2 28  GLUCOSE 115*  BUN 55*  CREATININE 3.53*  CALCIUM 12.6*  AST 19  ALT 10*  ALKPHOS 44  BILITOT 0.4   ------------------------------------------------------------------------------------------------------------------  Cardiac Enzymes  Recent Labs Lab 07/11/15 1146  TROPONINI 0.06*   ------------------------------------------------------------------------------------------------------------------  RADIOLOGY:  Ct Head Wo Contrast  07/11/2015  CLINICAL DATA:  Fall. EXAM: CT HEAD WITHOUT CONTRAST TECHNIQUE: Contiguous axial images were obtained from the base of the skull through the vertex without intravenous contrast. COMPARISON:  05/08/2008 FINDINGS: There is mild diffuse low-attenuation within the subcortical and periventricular white matter compatible with chronic microvascular disease. There is ex vacuo Wo dilatation of the sulci and ventricles identified. Chronic left basal ganglia lacunar infarct identified. No acute brain infarct identified. No acute intracranial hemorrhage or mass. The paranasal sinuses are clear. The mastoid air cells are also clear. The calvarium is intact. IMPRESSION: 1. Small vessel ischemic change and brain atrophy.  2. No acute intracranial abnormalities identified. Electronically Signed   By: Signa Kell M.D.   On: 07/11/2015 13:38   Dg Chest Portable 1 View  07/11/2015  CLINICAL DATA:  Failure to thrive. Slid out of chair this morning. Generalized weakness. EXAM: PORTABLE CHEST 1 VIEW COMPARISON:  07/24/2013 FINDINGS: Cardiac silhouette is upper limits of normal to mildly enlarged in size. Thoracic aortic calcification is again noted. No airspace consolidation, edema, pleural effusion, or pneumothorax is identified. No acute osseous abnormality is seen. IMPRESSION: No active disease. Electronically Signed   By: Sebastian Ache M.D.   On: 07/11/2015 12:46    EKG:   Orders placed or performed during the  hospital encounter of 07/11/15  . ED EKG  . ED EKG    IMPRESSION AND PLAN:   Pebbles Zeiders  is a 82 y.o. female with a known history of dementia, CAD, CKD with baseline creatinine of 2.5, DM, anemia from assisted living facility admitted with weakness and fall.  #1 acute renal failure-known CKD stage IV. Baseline creatinine around 2.5. Creatinine today is 3.5. -Likely prerenal. IV hydration and monitor. -If worsening or no improvement, then will order renal ultrasound. Urine analysis with no infection.  #2 elevated troponin-likely demand ischemia and also poor renal clearance from renal failure. -Recycle troponins. Patient denies any chest pain and no EKG changes.  #3 dementia-at baseline. Poor communication. Oriented to self. Continue home medications. Due to her weakness and fall, physical therapy consulted  #4 malnutrition-continue Remeron and ensure  #5 hypercalcemia-could be dehydration. Check PTH as well. IV fluids and monitor.  #6 HTN- continue home meds  #7 DVT Prophylaxis- Lovenox  Physical therapy consult and social worker consult    All the records are reviewed and case discussed with ED provider. Management plans discussed with the patient, family and they are in agreement.  CODE STATUS: DNR  TOTAL TIME TAKING CARE OF THIS PATIENT: 50 minutes.    Rigby Swamy M.D on 07/11/2015 at 3:55 PM  Between 7am to 6pm - Pager - [phone]  After 6pm go to [email or web address] - password [email or web address] [facility]  [place]  Office  [phone]  CC: Primary care physician; Ginette Otto, MD

## 2015-07-12 LAB — GLUCOSE, CAPILLARY
GLUCOSE-CAPILLARY: 88 mg/dL (ref 65–99)
GLUCOSE-CAPILLARY: 95 mg/dL (ref 65–99)
Glucose-Capillary: 119 mg/dL — ABNORMAL HIGH (ref 65–99)
Glucose-Capillary: 142 mg/dL — ABNORMAL HIGH (ref 65–99)

## 2015-07-12 LAB — BASIC METABOLIC PANEL
ANION GAP: 7 (ref 5–15)
BUN: 44 mg/dL — ABNORMAL HIGH (ref 6–20)
CALCIUM: 10.9 mg/dL — AB (ref 8.9–10.3)
CO2: 25 mmol/L (ref 22–32)
Chloride: 108 mmol/L (ref 101–111)
Creatinine, Ser: 2.93 mg/dL — ABNORMAL HIGH (ref 0.44–1.00)
GFR, EST AFRICAN AMERICAN: 15 mL/min — AB (ref >60)
GFR, EST NON AFRICAN AMERICAN: 13 mL/min — AB (ref >60)
GLUCOSE: 87 mg/dL (ref 65–99)
Potassium: 3.7 mmol/L (ref 3.5–5.1)
SODIUM: 140 mmol/L (ref 135–145)

## 2015-07-12 LAB — CBC
HCT: 35.1 % (ref 35.0–47.0)
HEMOGLOBIN: 11.9 g/dL — AB (ref 12.0–16.0)
MCH: 28.5 pg (ref 26.0–34.0)
MCHC: 33.8 g/dL (ref 32.0–36.0)
MCV: 84.2 fL (ref 80.0–100.0)
Platelets: 146 10*3/uL — ABNORMAL LOW (ref 150–440)
RBC: 4.17 MIL/uL (ref 3.80–5.20)
RDW: 15.3 % — ABNORMAL HIGH (ref 11.5–14.5)
WBC: 5.2 10*3/uL (ref 3.6–11.0)

## 2015-07-12 LAB — TROPONIN I: TROPONIN I: 0.05 ng/mL — AB (ref <0.031)

## 2015-07-12 MED ORDER — SODIUM CHLORIDE 0.9 % IV SOLN
INTRAVENOUS | Status: DC
  Administered 2015-07-12: 20:00:00 via INTRAVENOUS

## 2015-07-12 MED ORDER — AMLODIPINE BESYLATE 5 MG PO TABS
5.0000 mg | ORAL_TABLET | Freq: Once | ORAL | Status: AC
  Administered 2015-07-12: 5 mg via ORAL
  Filled 2015-07-12: qty 1

## 2015-07-12 MED ORDER — AMLODIPINE BESYLATE 10 MG PO TABS
10.0000 mg | ORAL_TABLET | Freq: Every day | ORAL | Status: DC
  Administered 2015-07-13 – 2015-07-14 (×2): 10 mg via ORAL
  Filled 2015-07-12 (×2): qty 1

## 2015-07-12 MED ORDER — SODIUM CHLORIDE 0.9 % IV SOLN: INTRAVENOUS | Status: DC

## 2015-07-12 MED ORDER — HYDRALAZINE HCL 20 MG/ML IJ SOLN: 10.0000 mg | INTRAMUSCULAR | Status: DC | PRN

## 2015-07-12 NOTE — Progress Notes (Signed)
[facility] [place] [facility]   PATIENT NAME: Denise MenghiniMolly Neal    MRN#:  [id number]  DATE OF BIRTH:  26-Feb-1921  SUBJECTIVE:  Hospital Day: 1 day Denise MenghiniMolly Barmore is a 82 y.o. female presenting with Failure To Thrive and Fall .   Overnight events: No overnight events Interval Events: Patient unable to provide information given mental status at baseline  REVIEW OF SYSTEMS:  Unable to obtain given patient's mental status at baseline  DRUG ALLERGIES:   Allergies  Allergen Reactions  . Codeine Other (See Comments)    Reaction: unknown  . Sulfa Antibiotics Hives    VITALS:  Blood pressure 158/78, pulse 78, temperature 98.1 F (36.7 C), temperature source Oral, resp. rate 16, height 5' (1.524 m), weight 119 lb (53.978 kg), SpO2 98 %.  PHYSICAL EXAMINATION:  VITAL SIGNS: Filed Vitals:   07/12/15 0728 07/12/15 0900  BP: 176/86 158/78  Pulse:    Temp:    Resp:     GENERAL:82 y.o.female currently in no acute distress.  HEAD: Normocephalic, atraumatic.  EYES: Pupils equal, round, reactive to light. Extraocular muscles intact. No scleral icterus.  MOUTH: Moist mucosal membrane. Dentition intact. No abscess noted.  EAR, NOSE, THROAT: Clear without exudates. No external lesions.  NECK: Supple. No thyromegaly. No nodules. No JVD.  PULMONARY: Clear to ascultation, without wheeze rails or rhonci. No use of accessory muscles, Good respiratory effort. good air entry bilaterally CHEST: Nontender to palpation.  CARDIOVASCULAR: S1 and S2. Regular rate and rhythm. No murmurs, rubs, or gallops. No edema. Pedal pulses 2+ bilaterally.  GASTROINTESTINAL: Soft, nontender, nondistended. No masses. Positive bowel sounds. No hepatosplenomegaly.  MUSCULOSKELETAL: No swelling, clubbing, or edema. Range of motion full in all extremities.  NEUROLOGIC: Cranial nerves II through XII are intact. No gross focal neurological deficits. Sensation intact. Reflexes intact.  SKIN: No  ulceration, lesions, rashes, or cyanosis. Skin warm and dry. Turgor poor.  PSYCHIATRIC: Mood, affect flat The patient is awake, alert and oriented self. Insight, judgment poor.      LABORATORY PANEL:   CBC  Recent Labs Lab 07/12/15 0351  WBC 5.2  HGB 11.9*  HCT 35.1  PLT 146*   ------------------------------------------------------------------------------------------------------------------  Chemistries   Recent Labs Lab 07/11/15 1502 07/12/15 0351  NA 139 140  K 4.5 3.7  CL 103 108  CO2 28 25  GLUCOSE 115* 87  BUN 55* 44*  CREATININE 3.53* 2.93*  CALCIUM 12.6* 10.9*  AST 19  --   ALT 10*  --   ALKPHOS 44  --   BILITOT 0.4  --    ------------------------------------------------------------------------------------------------------------------  Cardiac Enzymes  Recent Labs Lab 07/12/15 0351  TROPONINI 0.05*   ------------------------------------------------------------------------------------------------------------------  RADIOLOGY:  Ct Head Wo Contrast  07/11/2015  CLINICAL DATA:  Fall. EXAM: CT HEAD WITHOUT CONTRAST TECHNIQUE: Contiguous axial images were obtained from the base of the skull through the vertex without intravenous contrast. COMPARISON:  05/08/2008 FINDINGS: There is mild diffuse low-attenuation within the subcortical and periventricular white matter compatible with chronic microvascular disease. There is ex vacuo Wo dilatation of the sulci and ventricles identified. Chronic left basal ganglia lacunar infarct identified. No acute brain infarct identified. No acute intracranial hemorrhage or mass. The paranasal sinuses are clear. The mastoid air cells are also clear. The calvarium is intact. IMPRESSION: 1. Small vessel ischemic change and brain atrophy. 2. No acute intracranial abnormalities identified. Electronically Signed   By: Signa Kellaylor  Stroud M.D.   On: 07/11/2015 13:38   Dg Chest  Portable 1 View  07/11/2015  CLINICAL DATA:  Failure to thrive.  Slid out of chair this morning. Generalized weakness. EXAM: PORTABLE CHEST 1 VIEW COMPARISON:  07/24/2013 FINDINGS: Cardiac silhouette is upper limits of normal to mildly enlarged in size. Thoracic aortic calcification is again noted. No airspace consolidation, edema, pleural effusion, or pneumothorax is identified. No acute osseous abnormality is seen. IMPRESSION: No active disease. Electronically Signed   By: Sebastian Ache M.D.   On: 07/11/2015 12:46    EKG:   Orders placed or performed during the hospital encounter of 07/11/15  . ED EKG  . ED EKG    ASSESSMENT AND PLAN:   [name] is a 82 y.o. female presenting with Failure To Thrive and Fall . Admitted 07/11/2015 : Day #: 1 day 1. Acute renal failure: Likely prerenal in etiology continue IV fluid hydration and follow urine output renal function, avoid further nephrotoxic agents 2. Essential hypertension: Continue Norvasc, Imdur, Lopressor blood pressure somewhat elevated continue to follow prior to further adjustments 3. Dementia Aricept 4.Elevated troponin: Stable   All the records are reviewed and case discussed with Care Management/Social Workerr. Management plans discussed with the patient, family and they are in agreement.  CODE STATUS: dnr TOTAL TIME TAKING CARE OF THIS PATIENT: 28 minutes.   POSSIBLE D/C IN 1-2DAYS, DEPENDING ON CLINICAL CONDITION.   Hower,  Mardi Mainland.D on 07/12/2015 at 12:05 PM  Between 7am to 6pm - Pager - [phone]  After 6pm: House Pager: - [phone]  [facility]  Office  [phone]  CC: Primary care physician; Ginette Otto, MD

## 2015-07-12 NOTE — [place] FL2 (Signed)
[place] MEDICAID FL2 LEVEL OF CARE SCREENING TOOL     IDENTIFICATION  Patient Name: [name] Birthdate: 07/13/1921 Sex: female Admission Date (Current Location): 07/11/2015  [place]   Facility and Address:  [facility], [address], [place], [place] [address]      Provider Number: [id number]  Attending Physician Name and Address:  Wyatt Hasteavid K Hower, MD  Relative Name and Phone Number:       Current Level of Care: Hospital Recommended Level of Care: Assisted Living Facility Prior Approval Number:    Date Approved/Denied:   PASRR Number:    Discharge Planalf alf   Current Diagnoses: Patient Active Problem List   Diagnosis Date Noted  . ARF (acute renal failure) (HCC) 07/11/2015  . UTI (lower urinary tract infection) 06/23/2015  . Cholecystitis 05/29/2015  . Pressure ulcer 05/29/2015  . Old myocardial infarction 08/29/2013  . Essential hypertension 08/29/2013  . Hyperlipidemia 08/29/2013    Orientation RESPIRATION BLADDER Height & Weight     Self  Normal Incontinent Weight: 119 lb (53.978 kg) Height:  5' (152.4 cm)  BEHAVIORAL SYMPTOMS/MOOD NEUROLOGICAL BOWEL NUTRITION STATUS      Incontinent Diet (Diabetic)  AMBULATORY STATUS COMMUNICATION OF NEEDS Skin   Limited Assist Verbally Normal                       Personal Care Assistance Level of Assistance  Bathing, Feeding, Dressing, Total care Bathing Assistance: Maximum assistance Feeding assistance: Independent Dressing Assistance: Maximum assistance Total Care Assistance: Maximum assistance   Functional Limitations Info  Sight, Hearing, Speech Sight Info: Adequate Hearing Info: Adequate Speech Info: Adequate    SPECIAL CARE FACTORS FREQUENCY                       Contractures      Additional Factors Info  Code Status, Allergies Code Status Info: DNR Allergies Info: Codeine, Sulpha antibiotics           Current  Medications (07/12/2015):  This is the current hospital active medication list Current Facility-Administered Medications  Medication Dose Route Frequency Provider Last Rate Last Dose  . 0.9 %  sodium chloride infusion   Intravenous Continuous Enid Baasadhika Kalisetti, MD 60 mL/hr at 07/11/15 1903    . acetaminophen (TYLENOL) tablet 650 mg  650 mg Oral Q6H PRN Enid Baasadhika Kalisetti, MD       Or  . acetaminophen (TYLENOL) suppository 650 mg  650 mg Rectal Q6H PRN Enid Baasadhika Kalisetti, MD      . acitretin (SORIATANE) capsule 25 mg  25 mg Oral See admin instructions Enid Baasadhika Kalisetti, MD      . amLODipine (NORVASC) tablet 5 mg  5 mg Oral Daily Enid Baasadhika Kalisetti, MD   5 mg at 07/12/15 0737  . calcium carbonate (TUMS - dosed in mg elemental calcium) chewable tablet 600 mg of elemental calcium  600 mg of elemental calcium Oral BID WC Cindi CarbonMary M Swayne, RPH   600 mg of elemental calcium at 07/12/15 0737  . cholecalciferol (VITAMIN D) tablet 2,000 Units  2,000 Units Oral Daily Enid Baasadhika Kalisetti, MD   2,000 Units at 07/12/15 0739  . docusate sodium (COLACE) capsule 100 mg  100 mg Oral BID Enid Baasadhika Kalisetti, MD   100 mg at 07/12/15 0738  . donepezil (ARICEPT) tablet 5 mg  5 mg Oral QHS Enid Baasadhika Kalisetti, MD   5 mg at 07/11/15 2115  . famotidine (PEPCID) tablet 20 mg  20 mg Oral QHS Enid Baas, MD   20 mg at 07/11/15 2115  . feeding supplement (ENSURE ENLIVE) (ENSURE ENLIVE) liquid 237 mL  237 mL Oral TID BM Enid Baas, MD   237 mL at 07/11/15 2000  . heparin injection 5,000 Units  5,000 Units Subcutaneous Q12H Jodelle Red [place], [facility]   5,000 Units at 07/12/15 9147  . isosorbide mononitrate (IMDUR) 24 hr tablet 30 mg  30 mg Oral Daily Enid Baas, MD   30 mg at 07/12/15 0740  . metoprolol (LOPRESSOR) tablet 50 mg  50 mg Oral BID Enid Baas, MD   50 mg at 07/12/15 0739  . mirtazapine (REMERON) tablet 15 mg  15 mg Oral QHS Enid Baas, MD   15 mg at 07/11/15 2115  . ondansetron (ZOFRAN) tablet 4 mg  4  mg Oral Q6H PRN Enid Baas, MD       Or  . ondansetron (ZOFRAN) injection 4 mg  4 mg Intravenous Q6H PRN Enid Baas, MD      . vitamin B-12 (CYANOCOBALAMIN) tablet 2,000 mcg  2,000 mcg Oral Daily Enid Baas, MD   2,000 mcg at 07/12/15 0740     Discharge Medications: Please see discharge summary for a list of discharge medications.  Relevant Imaging Results:  Relevant Lab Results:   Additional Information SSN [id number]  Cheron Schaumann, [place]

## 2015-07-12 NOTE — Care Management Important Message (Signed)
Important Message  Patient Details  Name: [name] MRN: [id number] Date of Birth: 29-Nov-1921   Medicare Important Message Given:  Yes    Elfrida Pixley A, RN 07/12/2015, 2:34 PM

## 2015-07-12 NOTE — Progress Notes (Signed)
Pt's BP is elevated at 164/76 manual while resting in bed. Dr. Clint GuyHower notified. Obtained order for PRN hydralazine and an increase in her daily amlodipine dosage.

## 2015-07-12 NOTE — Clinical Social Work Note (Signed)
Clinical Social Work Assessment  Patient Details  Name: [name] MRN: [id number] Date of Birth: April 06, 1921  Date of referral:  07/12/15               Reason for consult:  Facility Placement                Permission sought to share information with:  Family Supports, Chartered certified accountant granted to share information::  Yes, Verbal Permission Granted  Name::     [place]::  [facility]  Relationship::     Contact Information:     Housing/Transportation Living arrangements for the past 2 months:  [place] of Information:  Patient, Adult Children Patient Interpreter Needed:  None Criminal Activity/Legal Involvement Pertinent to Current Situation/Hospitalization:  No - Comment as needed Significant Relationships:  Adult Children Lives with:  Facility Resident Do you feel safe going back to the place where you live?  Yes Need for family participation in patient care:  Yes (Comment)  Care giving concerns: Daughter has noticed increased weakness. May want to consider higher level of care/ family is unable to transport   Facilities manager / plan: LCSW met with patient and she was calm and polite and gave verbal consent to speak to her daughter. It became rather obvious that patient has dementia as she was oriented to self only. LCSW called daughter and collected data  To complete Fl2 and this assessment. Patient requires full assistance with her ADL's and uses a walker. She is a diabetic and requires diabetic diet. Patient has dementia and is oriented to self and place but becomes confused. Patient has Medicare and Medicaid. Patient has good family support.  Employment status:  Retired Forensic scientist:  Information systems manager, Medicaid In [place] PT Recommendations:  Not assessed at this time [place] / Referral to community resources:  [place]  Patient/Family's Response to care: Open to higher  level of care if needed  Patient/Family's Understanding of and Emotional Response to Diagnosis, Current Treatment, and Prognosis:  Good understanding.  Emotional Assessment Appearance:  Appears stated age Attitude/Demeanor/Rapport:   (Polite and kind [place]) Affect (typically observed):  Happy, Calm Orientation:  Oriented to Self Alcohol / Substance use:  Never Used Psych involvement (Current and /or in the community):  No (Comment)  Discharge Needs  Concerns to be addressed:  No discharge needs identified Readmission within the last 30 days:   yes Current discharge risk:  None Barriers to Discharge:  Continued Medical Work up   [place], Glennon Hamilton, LCSW 07/12/2015, 9:35 AM

## 2015-07-12 NOTE — Evaluation (Signed)
Physical Therapy Evaluation Patient Details Name: [name] MRN: [id number] DOB: [birth year] Today's Date: 07/12/2015   History of Present Illness  82 y.o. female with a known history of dementia, CAD, CKD with baseline creatinine of 2.5, DM, anemia from assisted living facility admitted with weakness and fall.  Clinical Impression  Pt is not able to fully answer questions but she was able to follow all simple instructions in real time with apparent good understanding.  She showed good effort with PT exam and was able to assist with getting to sitting, getting to standing and ultimately take a few small steps to EOB.    Follow Up Recommendations SNF    Equipment Recommendations       Recommendations for Other Services       Precautions / Restrictions Precautions Precautions: Fall Restrictions Weight Bearing Restrictions: No      Mobility  Bed Mobility Overal bed mobility: Needs Assistance Bed Mobility: Supine to Sit     Supine to sit: Mod assist     General bed mobility comments: Pt is able to show fair effort in getting to EOB but has definite need of assist to move LEs and lift trunk  Transfers Overall transfer level: Needs assistance Equipment used: Rolling walker (2 wheeled) Transfers: Sit to/from Stand Sit to Stand: Mod assist;Min assist         General transfer comment: Pt needing some assist to initially lift off the bed, but once up she was able to maintain standing balance with walker despite hunched posture and some hesitancy  Ambulation/Gait Ambulation/Gait assistance: Min assist Ambulation Distance (Feet): 4 Feet Assistive device: Rolling walker (2 wheeled)       General Gait Details: Pt is able to take a few small steps to the side to get to reclienr.  She was able to lift knees to clear feet.  Stairs            Wheelchair Mobility    Modified Rankin (Stroke Patients Only)       Balance                                              Pertinent Vitals/Pain Pain Assessment: No/denies pain    Home Living Family/patient expects to be discharged to:: Skilled nursing facility                      Prior Function Level of Independence: Needs assistance         Comments: Pt unable to report PLOF, she was apparently at an ALF and using a walker.     Hand Dominance        Extremity/Trunk Assessment   Upper Extremity Assessment: Generalized weakness (lacks shoulder elevation >90 b/l, otherwise 3+/5 t/o)           Lower Extremity Assessment: Generalized weakness (grossly 3+/5, slow movement with fair quality of motion)         Communication   Communication: Expressive difficulties  Cognition   Behavior During Therapy: Flat affect Overall Cognitive Status: Difficult to assess                      General Comments      Exercises        Assessment/Plan    PT Assessment Patient needs continued PT services  PT Diagnosis Difficulty walking;Generalized  weakness   PT Problem List Decreased strength;Decreased activity tolerance;Decreased balance;Decreased range of motion;Decreased mobility;Decreased coordination;Decreased cognition;Decreased knowledge of use of DME;Decreased safety awareness  PT Treatment Interventions DME instruction;Functional mobility training;Therapeutic activities;Gait training;Therapeutic exercise;Balance training;Neuromuscular re-education   PT Goals (Current goals can be found in the Care Plan section) Acute Rehab PT Goals Patient Stated Goal: unable to state goals PT Goal Formulation: Patient unable to participate in goal setting Time For Goal Achievement: 07/26/15 Potential to Achieve Goals: Fair    Frequency Min 2X/week   Barriers to discharge        Co-evaluation               End of Session Equipment Utilized During Treatment: Gait belt Activity Tolerance:  (mental status) Patient left: with chair alarm set;with call  bell/phone within reach Nurse Communication: Mobility status         Time: [id number] PT Time Calculation (min) (ACUTE ONLY): 21 min   Charges:   PT Evaluation $PT Eval Low Complexity: 1 Procedure     PT G CodesMalachi Pro:        Suellen Durocher R Temiloluwa [place], DPT 07/12/2015, 1:53 PM

## 2015-07-13 LAB — BASIC METABOLIC PANEL
Anion gap: 7 (ref 5–15)
BUN: 43 mg/dL — AB (ref 6–20)
CALCIUM: 10.8 mg/dL — AB (ref 8.9–10.3)
CO2: 25 mmol/L (ref 22–32)
CREATININE: 2.87 mg/dL — AB (ref 0.44–1.00)
Chloride: 105 mmol/L (ref 101–111)
GFR calc Af Amer: 15 mL/min — ABNORMAL LOW (ref >60)
GFR calc non Af Amer: 13 mL/min — ABNORMAL LOW (ref >60)
GLUCOSE: 92 mg/dL (ref 65–99)
Potassium: 4 mmol/L (ref 3.5–5.1)
Sodium: 137 mmol/L (ref 135–145)

## 2015-07-13 LAB — CBC
HCT: 35.4 % (ref 35.0–47.0)
Hemoglobin: 12 g/dL (ref 12.0–16.0)
MCH: 28.6 pg (ref 26.0–34.0)
MCHC: 33.9 g/dL (ref 32.0–36.0)
MCV: 84.4 fL (ref 80.0–100.0)
PLATELETS: 159 10*3/uL (ref 150–440)
RBC: 4.19 MIL/uL (ref 3.80–5.20)
RDW: 15.5 % — AB (ref 11.5–14.5)
WBC: 5.6 10*3/uL (ref 3.6–11.0)

## 2015-07-13 MED ORDER — ACITRETIN 25 MG PO CAPS: 25.0000 mg | ORAL_CAPSULE | ORAL | Status: DC

## 2015-07-13 MED ORDER — ACITRETIN 25 MG PO CAPS
25.0000 mg | ORAL_CAPSULE | ORAL | Status: DC
  Administered 2015-07-13: 25 mg via ORAL
  Filled 2015-07-13: qty 1

## 2015-07-13 NOTE — Clinical Social Work Note (Signed)
PT is recommending SNF for STR. CSW spoke with pt's daughter, Horace PorteousMadge, who is agreeable to discharge plan. CSW initiated SNF search and will follow up with bed offers. CSW will continue to follow.   Dede QuerySarah Harlie Ragle, MSW, LCSW  Clinical Social Worker  [phone]

## 2015-07-13 NOTE — Plan of Care (Signed)
Problem: Safety: Goal: Ability to remain free from injury will improve Outcome: Not Progressing Patient confused and not able to retain information. Tries to get out of bed without assistance. Reeducated on safety.

## 2015-07-13 NOTE — [place] FL2 (Signed)
[place] MEDICAID FL2 LEVEL OF CARE SCREENING TOOL     IDENTIFICATION  Patient Name: [name] Birthdate: 14-Apr-1921 Sex: female Admission Date (Current Location): 07/11/2015  [place]   Facility and Address:  [facility], [address], [place], [place] [address]      Provider Number: [id number]  Attending Physician Name and Address:  Wyatt Hasteavid K Hower, MD  Relative Name and Phone Number:       Current Level of Care: Hospital Recommended Level of Care: Skilled Nursing Facility Prior Approval Number:    Date Approved/Denied:   PASRR Number:    Discharge Plan: SNF    Current Diagnoses: Patient Active Problem List   Diagnosis Date Noted  . ARF (acute renal failure) (HCC) 07/11/2015  . UTI (lower urinary tract infection) 06/23/2015  . Cholecystitis 05/29/2015  . Pressure ulcer 05/29/2015  . Old myocardial infarction 08/29/2013  . Essential hypertension 08/29/2013  . Hyperlipidemia 08/29/2013    Orientation RESPIRATION BLADDER Height & Weight     Self, Situation, Place, Time  Normal Incontinent Weight: 125 lb 6.4 oz (56.881 kg) Height:  5' (152.4 cm)  BEHAVIORAL SYMPTOMS/MOOD NEUROLOGICAL BOWEL NUTRITION STATUS      Incontinent Diet (2 Grams Sodium)  AMBULATORY STATUS COMMUNICATION OF NEEDS Skin   Limited Assist Verbally Normal                       Personal Care Assistance Level of Assistance  Bathing, Feeding, Dressing Bathing Assistance: Limited assistance Feeding assistance: Independent Dressing Assistance: Limited assistance Total Care Assistance: Limited assistance   Functional Limitations Info  Sight, Hearing, Speech Sight Info: Adequate Hearing Info: Adequate Speech Info: Adequate    SPECIAL CARE FACTORS FREQUENCY  PT (By licensed PT)                    Contractures      Additional Factors Info  Allergies, Code Status Code Status Info: DNR Allergies Info: Codeine, Sulfa  Antibiotics           Current Medications (07/13/2015):  This is the current hospital active medication list Current Facility-Administered Medications  Medication Dose Route Frequency Provider Last Rate Last Dose  . 0.9 %  sodium chloride infusion   Intravenous Continuous Enid Baasadhika Kalisetti, MD 60 mL/hr at 07/12/15 1953    . acetaminophen (TYLENOL) tablet 650 mg  650 mg Oral Q6H PRN Enid Baasadhika Kalisetti, MD       Or  . acetaminophen (TYLENOL) suppository 650 mg  650 mg Rectal Q6H PRN Enid Baasadhika Kalisetti, MD      . acitretin (SORIATANE) capsule 25 mg  25 mg Oral Q M,W,F Wyatt Hasteavid K Hower, MD      . Melene Muller[START ON 07/14/2015] acitretin (SORIATANE) capsule 25 mg  25 mg Oral Q Tue Wyatt Hasteavid K Hower, MD      . Melene Muller[START ON 07/16/2015] acitretin (SORIATANE) capsule 25 mg  25 mg Oral Q Thu Wyatt Hasteavid K Hower, MD      . amLODipine (NORVASC) tablet 10 mg  10 mg Oral Daily Wyatt Hasteavid K Hower, MD   10 mg at 07/13/15 0900  . calcium carbonate (TUMS - dosed in mg elemental calcium) chewable tablet 600 mg of elemental calcium  600 mg of elemental calcium Oral BID WC Cindi CarbonMary M Swayne, RPH   600 mg of elemental calcium at 07/13/15 0900  . cholecalciferol (VITAMIN D) tablet 2,000 Units  2,000 Units Oral Daily Enid Baasadhika Kalisetti, MD  2,000 Units at 07/13/15 0859  . docusate sodium (COLACE) capsule 100 mg  100 mg Oral BID Enid Baas, MD   100 mg at 07/13/15 0859  . donepezil (ARICEPT) tablet 5 mg  5 mg Oral QHS Enid Baas, MD   5 mg at 07/12/15 2100  . famotidine (PEPCID) tablet 20 mg  20 mg Oral QHS Enid Baas, MD   20 mg at 07/12/15 2100  . feeding supplement (ENSURE ENLIVE) (ENSURE ENLIVE) liquid 237 mL  237 mL Oral TID BM Enid Baas, MD   237 mL at 07/13/15 1000  . heparin injection 5,000 Units  5,000 Units Subcutaneous Q12H Jodelle Red [place], [facility]   5,000 Units at 07/13/15 4098  . hydrALAZINE (APRESOLINE) injection 10 mg  10 mg Intravenous Q4H PRN Wyatt Haste, MD      . isosorbide mononitrate (IMDUR) 24 hr tablet 30 mg   30 mg Oral Daily Enid Baas, MD   30 mg at 07/13/15 0859  . metoprolol (LOPRESSOR) tablet 50 mg  50 mg Oral BID Enid Baas, MD   50 mg at 07/13/15 0900  . mirtazapine (REMERON) tablet 15 mg  15 mg Oral QHS Enid Baas, MD   15 mg at 07/12/15 2100  . ondansetron (ZOFRAN) tablet 4 mg  4 mg Oral Q6H PRN Enid Baas, MD       Or  . ondansetron (ZOFRAN) injection 4 mg  4 mg Intravenous Q6H PRN Enid Baas, MD      . vitamin B-12 (CYANOCOBALAMIN) tablet 2,000 mcg  2,000 mcg Oral Daily Enid Baas, MD   2,000 mcg at 07/13/15 1191     Discharge Medications: Please see discharge summary for a list of discharge medications.  Relevant Imaging Results:  Relevant Lab Results:   Additional Information SSN:  [id number]  Dede Query, LCSW

## 2015-07-13 NOTE — Progress Notes (Signed)
Physical Therapy Treatment Patient Details Name: [name] MRN: [id number] DOB: February 15, 1921 Today's Date: 07/13/2015    History of Present Illness 82 y.o. female with a known history of dementia, CAD, CKD with baseline creatinine of 2.5, DM, anemia from assisted living facility admitted with weakness and fall.    PT Comments    Pt in bed, agrees to session.  Supine to edge of bed with mod a x 1.  Once sitting on edge of bed she is able to sit without support but unsafe to leave unattended.  Stood with walker and mod a x 1.  Pt self terminated standing stating she sat before she "fell down".  Stand pivot to recliner at bedside with mod a x 1 with poor foot movement/stepping for transfer.  Participated in seated exercises as described below.    Follow Up Recommendations  SNF     Equipment Recommendations       Recommendations for Other Services       Precautions / Restrictions Precautions Precautions: Fall Restrictions Weight Bearing Restrictions: No    Mobility  Bed Mobility Overal bed mobility: Needs Assistance Bed Mobility: Supine to Sit     Supine to sit: Mod assist        Transfers Overall transfer level: Needs assistance Equipment used: Rolling walker (2 wheeled) Transfers: Sit to/from [employer] Transfers Sit to Stand: Mod assist Stand pivot transfers: Mod assist       General transfer comment: weakness and fear limit mobility  Ambulation/Gait                 Stairs            Wheelchair Mobility    Modified Rankin (Stroke Patients Only)       Balance Overall balance assessment: Needs assistance Sitting-balance support: Feet supported Sitting balance-Leahy Scale: Poor     Standing balance support: Bilateral upper extremity supported Standing balance-Leahy Scale: Poor                      Cognition   Behavior During Therapy: Flat affect Overall Cognitive Status: Difficult to assess                       Exercises General Exercises - Lower Extremity Ankle Circles/Pumps: AROM;Both;20 reps;Seated Long Arc Quad: AROM;Both;20 reps;Seated Hip Flexion/Marching: AAROM;Both;10 reps;Seated    General Comments        Pertinent Vitals/Pain Pain Assessment: No/denies pain    Home Living                      Prior Function            PT Goals (current goals can now be found in the care plan section) Acute Rehab PT Goals Patient Stated Goal: unable to state goals    Frequency  Min 2X/week    PT Plan Current plan remains appropriate    Co-evaluation             End of Session Equipment Utilized During Treatment: Gait belt Activity Tolerance: Patient limited by fatigue Patient left: with chair alarm set;with call bell/phone within reach;with nursing/sitter in room     Time: 0842-0908 PT Time Calculation (min) (ACUTE ONLY): 26 min  Charges:  $Therapeutic Exercise: 8-22 mins $Therapeutic Activity: 8-22 mins                    G Codes:      [name]  [name], PTA 07/13/2015, 9:34 AM

## 2015-07-13 NOTE — Progress Notes (Signed)
Initial Nutrition Assessment  DOCUMENTATION CODES:   Not applicable  INTERVENTION:  -Monitor intake. Recommend dysphagia 3 diet as pt with limited teeth and liberalizing diet secondary to poor po intake -Continue to encourage ensure for added nutrition   NUTRITION DIAGNOSIS:   Inadequate oral intake related to acute illness as evidenced by per patient/family report.    GOAL:   Patient will meet greater than or equal to 90% of their needs    MONITOR:   PO intake, Supplement acceptance  REASON FOR ASSESSMENT:   Malnutrition Screening Tool, Consult Poor PO  ASSESSMENT:      Pt admitted with FTT, fall, ARF. Recent admission 2 weeks ago with UTI  Past Medical History  Diagnosis Date  . Old myocardial infarction   . Essential (primary) hypertension   . Hyperlipidemia   . Diabetes mellitus without complication (HCC)   . Rhabdomyolysis   . Anemia   . Arthritis   . Dementia    No family at bedside and pt unable to answer questions regarding intake prior to admission.  Noted ensure container at bedside and empty.  Pt reports likes vanilla.  Noted per I and O sheet intake on average 25-50% of meals  Medications reviewed: remeron  Labs reviewed: BUn 43, creatinine 2.87  Nutrition-Focused physical exam completed. Findings are no fat depletion, moderate muscle depletion, and unable to assess edema.     Diet Order:  Diet 2 gram sodium Room service appropriate?: Yes; Fluid consistency:: Thin  Skin:  Reviewed, no issues  Last BM:  6/5  Height:   Ht Readings from Last 1 Encounters:  07/11/15 5' (1.524 m)    Weight: Noted per assessment on 5/17 Dtr had reported UBW of 119-125 pounds.  Wt Readings from Last 1 Encounters:  07/13/15 125 lb 6.4 oz (56.881 kg)    Ideal Body Weight:   Noted 2% wt loss in 1 month per wt encounter  BMI:  Body mass index is 24.49 kg/(m^2).  Estimated Nutritional Needs:   Kcal:  1450-1740 kcals/d  Protein:  72-87 g/d  Fluid:   1.4-1.7 L/d  EDUCATION NEEDS:   No education needs identified at this time  Jahzeel Poythress B. Freida BusmanAllen, RD, LDN [phone] (pager) Weekend/On-Call pager [phone])

## 2015-07-13 NOTE — Progress Notes (Signed)
[facility] - [place] at [facility]   PATIENT NAME: [name]    MRN#:  [id number]  DATE OF BIRTH:  Jan 06, 1922  SUBJECTIVE:  Hospital Day: 2 days [name] is a 82 y.o. female presenting with Failure To Thrive and Fall .   Overnight events: No overnight events Interval Events: Patient unable to provide information given mental status at baseline  REVIEW OF SYSTEMS:  Unable to obtain given patient's mental status at baseline  DRUG ALLERGIES:   Allergies  Allergen Reactions  . Codeine Other (See Comments)    Reaction: unknown  . Sulfa Antibiotics Hives    VITALS:  Blood pressure 185/74, pulse 58, temperature 98 F (36.7 C), temperature source Oral, resp. rate 18, height 5' (1.524 m), weight 125 lb 6.4 oz (56.881 kg), SpO2 96 %.  PHYSICAL EXAMINATION:  VITAL SIGNS: Filed Vitals:   07/13/15 0806 07/13/15 0808  BP: 173/81 185/74  Pulse: 61 58  Temp: 98 F (36.7 C)   Resp: 18    GENERAL:82 y.o.female currently in no acute distress.  HEAD: Normocephalic, atraumatic.  EYES: Pupils equal, round, reactive to light. Extraocular muscles intact. No scleral icterus.  MOUTH: Moist mucosal membrane. Dentition intact. No abscess noted.  EAR, NOSE, THROAT: Clear without exudates. No external lesions.  NECK: Supple. No thyromegaly. No nodules. No JVD.  PULMONARY: Clear to ascultation, without wheeze rails or rhonci. No use of accessory muscles, Good respiratory effort. good air entry bilaterally CHEST: Nontender to palpation.  CARDIOVASCULAR: S1 and S2. Regular rate and rhythm. No murmurs, rubs, or gallops. No edema. Pedal pulses 2+ bilaterally.  GASTROINTESTINAL: Soft, nontender, nondistended. No masses. Positive bowel sounds. No hepatosplenomegaly.  MUSCULOSKELETAL: No swelling, clubbing, or edema. Range of motion full in all extremities.  NEUROLOGIC: Cranial nerves II through XII are intact. No gross focal neurological deficits. Sensation intact.  Reflexes intact.  SKIN: No ulceration, lesions, rashes, or cyanosis. Skin warm and dry. Turgor poor.  PSYCHIATRIC: Mood, affect flat The patient is awake, alert and oriented self. Insight, judgment poor.      LABORATORY PANEL:   CBC  Recent Labs Lab 07/13/15 0426  WBC 5.6  HGB 12.0  HCT 35.4  PLT 159   ------------------------------------------------------------------------------------------------------------------  Chemistries   Recent Labs Lab 07/11/15 1502  07/13/15 0426  NA 139  < > 137  K 4.5  < > 4.0  CL 103  < > 105  CO2 28  < > 25  GLUCOSE 115*  < > 92  BUN 55*  < > 43*  CREATININE 3.53*  < > 2.87*  CALCIUM 12.6*  < > 10.8*  AST 19  --   --   ALT 10*  --   --   ALKPHOS 44  --   --   BILITOT 0.4  --   --   < > = values in this interval not displayed. ------------------------------------------------------------------------------------------------------------------  Cardiac Enzymes  Recent Labs Lab 07/12/15 0351  TROPONINI 0.05*   ------------------------------------------------------------------------------------------------------------------  RADIOLOGY:  No results found.  EKG:   Orders placed or performed during the hospital encounter of 07/11/15  . ED EKG  . ED EKG    ASSESSMENT AND PLAN:   [name] is a 82 y.o. female presenting with Failure To Thrive and Fall . Admitted 07/11/2015 : Day #: 2 days 1. Acute renal failure: Likely prerenal in etiology continue IV fluid hydration and follow urine output renal function, avoid further nephrotoxic agents 2. Essential hypertension: Continue Norvasc, Imdur, Lopressor -  Increase Norvasc today for follow-up blood pressure trend may require further titration  3. Dementia Aricept 4.Elevated troponin: Stable   All the records are reviewed and case discussed with Care Management/Social Workerr. Management plans discussed with the patient, family and they are in agreement.  CODE STATUS: dnr TOTAL  TIME TAKING CARE OF THIS PATIENT: 28 minutes.   POSSIBLE D/C IN 1-2DAYS, DEPENDING ON CLINICAL CONDITION.   [name],  Mardi MainlandDavid K M.D on 07/13/2015 at 2:43 PM  Between 7am to 6pm - Pager - [phone]  After 6pm: House Pager: - [phone]  [facility]  Office  [phone]  CC: Primary care physician; Ginette OttoSTONEKING,HAL THOMAS, MD

## 2015-07-13 NOTE — Plan of Care (Deleted)
Problem: Safety: Goal: Ability to remain free from injury will improve Outcome: Completed/Met Date Met:  07/13/15 Patient calls out for assistance when needed.

## 2015-07-14 MED ORDER — AMLODIPINE BESYLATE 10 MG PO TABS: 10.0000 mg | ORAL_TABLET | Freq: Every day | ORAL | Status: DC

## 2015-07-14 MED ORDER — ENSURE ENLIVE PO LIQD: 237.0000 mL | Freq: Three times a day (TID) | ORAL | Status: DC

## 2015-07-14 NOTE — Progress Notes (Signed)
[facility]. EMS was notified of transportation need

## 2015-07-14 NOTE — Clinical Social Work Note (Signed)
CSW provided bed offers as pt is ready for discharge today. Pt's daughter chose [facility]. Facility has recoeved discharge information and is ready to admit pt. Pt's family is aware and agreeable to discharge plan. RN called report and EMS provided transportation to facility. CSW is signing off as no further needs identified.   Dede QuerySarah Aquila Delaughter, MSW, LCSW Clinical Social Worker [phone]

## 2015-07-14 NOTE — Discharge Summary (Signed)
[facility] - [place] at [facility]   PATIENT NAME: Denise MenghiniMolly Neal    MR#:  [id number]  DATE OF BIRTH:  09/16/1921  DATE OF ADMISSION:  07/11/2015 ADMITTING PHYSICIAN: Enid Baasadhika Kalisetti, MD  DATE OF DISCHARGE: 07/14/2015  PRIMARY CARE PHYSICIAN: Ginette OttoSTONEKING,HAL THOMAS, MD    ADMISSION DIAGNOSIS:  Dehydration [E86.0] Fall, initial encounter [W19.XXXA]  DISCHARGE DIAGNOSIS:  Active Problems:   ARF (acute renal failure) (HCC) poorly controlled essential hypertension  SECONDARY DIAGNOSIS:   Past Medical History  Diagnosis Date  . Old myocardial infarction   . Essential (primary) hypertension   . Hyperlipidemia   . Diabetes mellitus without complication (HCC)   . Rhabdomyolysis   . Anemia   . Arthritis   . Dementia     HOSPITAL COURSE:  Denise MenghiniMolly Erven  is a 82 y.o. female admitted 07/11/2015 with chief complaint Failure To Thrive and Fall . Please see H&P performed by Enid Baasadhika Kalisetti, MD for further information. Patient presented with the above symptoms. Found to be in acute on chronic renal failure secondary to poor oral intake. She has hydrated with improvement in her renal function. She also had an issue with hypertension. Her Norvasc was increased which seemed to improve her blood pressure.   DISCHARGE CONDITIONS:   stable  CONSULTS OBTAINED:     DRUG ALLERGIES:   Allergies  Allergen Reactions  . Codeine Other (See Comments)    Reaction: unknown  . Sulfa Antibiotics Hives    DISCHARGE MEDICATIONS:   Current Discharge Medication List    START taking these medications   Details  feeding supplement, ENSURE ENLIVE, (ENSURE ENLIVE) LIQD Take 237 mLs by mouth 3 (three) times daily between meals. Qty: 237 mL, Refills: 12      CONTINUE these medications which have CHANGED   Details  !! amLODipine (NORVASC) 10 MG tablet Take 1 tablet (10 mg total) by mouth daily. Qty: 30 tablet, Refills: 0    !! amLODipine (NORVASC) 10 MG tablet Take 1 tablet  (10 mg total) by mouth daily. Qty: 30 tablet, Refills: 0     !! - Potential duplicate medications found. Please discuss with provider.    CONTINUE these medications which have NOT CHANGED   Details  acitretin (SORIATANE) 25 MG capsule Take 25 mg by mouth See admin instructions. Takes 25mg  five times weekly (Monday-Friday)    calcium carbonate (OS-CAL) 600 MG TABS tablet Take 600 mg by mouth 2 (two) times daily with a meal.    Cholecalciferol (VITAMIN D) 2000 UNITS CAPS Take 2,000 Units by mouth daily.    docusate sodium (COLACE) 100 MG capsule Take 1 capsule (100 mg total) by mouth 2 (two) times daily. Hold for > 2 bowel movements in a day Qty: 30 capsule, Refills: 0    donepezil (ARICEPT) 5 MG tablet Take 5 mg by mouth at bedtime.     isosorbide mononitrate (IMDUR) 30 MG 24 hr tablet Take 1 tablet (30 mg total) by mouth daily. Qty: 30 tablet, Refills: 2    metoprolol (LOPRESSOR) 50 MG tablet Take 1 tablet (50 mg total) by mouth 2 (two) times daily. Qty: 60 tablet, Refills: 0    mirtazapine (REMERON) 15 MG tablet Take 15 mg by mouth at bedtime.     ondansetron (ZOFRAN ODT) 4 MG disintegrating tablet Take 1 tablet (4 mg total) by mouth every 8 (eight) hours as needed for nausea or vomiting. Qty: 30 tablet, Refills: 0    ranitidine (ZANTAC) 150 MG tablet Take 150 mg  by mouth at bedtime.    shark liver oil-cocoa butter (PREPARATION H) 0.25-3-85.5 % suppository Place 1 suppository rectally 4 (four) times daily as needed for hemorrhoids.    triamcinolone cream (KENALOG) 0.1 % Apply 1 application topically 2 (two) times daily.    vitamin B-12 (CYANOCOBALAMIN) 1000 MCG tablet Take 2,000 mcg by mouth daily.      STOP taking these medications     cephALEXin (KEFLEX) 250 MG capsule          DISCHARGE INSTRUCTIONS:    DIET:  Regular diet  DISCHARGE CONDITION:  Stable  ACTIVITY:  Activity as tolerated  OXYGEN:  Home Oxygen: No.   Oxygen Delivery: room air  DISCHARGE  LOCATION:  nursing home   If you experience worsening of your admission symptoms, develop shortness of breath, life threatening emergency, suicidal or homicidal thoughts you must seek medical attention immediately by calling 911 or calling your MD immediately  if symptoms less severe.  You Must read complete instructions/literature along with all the possible adverse reactions/side effects for all the Medicines you take and that have been prescribed to you. Take any new Medicines after you have completely understood and accpet all the possible adverse reactions/side effects.   Please note  You were cared for by a hospitalist during your hospital stay. If you have any questions about your discharge medications or the care you received while you were in the hospital after you are discharged, you can call the unit and asked to speak with the hospitalist on call if the hospitalist that took care of you is not available. Once you are discharged, your primary care physician will handle any further medical issues. Please note that NO REFILLS for any discharge medications will be authorized once you are discharged, as it is imperative that you return to your primary care physician (or establish a relationship with a primary care physician if you do not have one) for your aftercare needs so that they can reassess your need for medications and monitor your lab values.    On the day of Discharge:   VITAL SIGNS:  Blood pressure 170/63, pulse 60, temperature 98.3 F (36.8 C), temperature source Axillary, resp. rate 16, height 5' (1.524 m), weight 125 lb 6.4 oz (56.881 kg), SpO2 96 %.  I/O:   Intake/Output Summary (Last 24 hours) at 07/14/15 0941 Last data filed at 07/13/15 1537  Gross per 24 hour  Intake    718 ml  Output      0 ml  Net    718 ml    PHYSICAL EXAMINATION:  GENERAL:  82 y.o.-year-old patient lying in the bed with no acute distress.  EYES: Pupils equal, round, reactive to light and  accommodation. No scleral icterus. Extraocular muscles intact.  HEENT: Head atraumatic, normocephalic. Oropharynx and nasopharynx clear.  NECK:  Supple, no jugular venous distention. No thyroid enlargement, no tenderness.  LUNGS: Normal breath sounds bilaterally, no wheezing, rales,rhonchi or crepitation. No use of accessory muscles of respiration.  CARDIOVASCULAR: S1, S2 normal. No murmurs, rubs, or gallops.  ABDOMEN: Soft, non-tender, non-distended. Bowel sounds present. No organomegaly or mass.  EXTREMITIES: No pedal edema, cyanosis, or clubbing.  NEUROLOGIC: Cranial nerves II through XII are intact. Muscle strength 5/5 in all extremities. Sensation intact. Gait not checked.  PSYCHIATRIC: The patient is alert and oriented x self.  SKIN: No obvious rash, lesion, or ulcer.   DATA REVIEW:   CBC  Recent Labs Lab 07/13/15 0426  WBC 5.6  HGB  12.0  HCT 35.4  PLT 159    Chemistries   Recent Labs Lab 07/11/15 1502  07/13/15 0426  NA 139  < > 137  K 4.5  < > 4.0  CL 103  < > 105  CO2 28  < > 25  GLUCOSE 115*  < > 92  BUN 55*  < > 43*  CREATININE 3.53*  < > 2.87*  CALCIUM 12.6*  < > 10.8*  AST 19  --   --   ALT 10*  --   --   ALKPHOS 44  --   --   BILITOT 0.4  --   --   < > = values in this interval not displayed.  Cardiac Enzymes  Recent Labs Lab 07/12/15 0351  TROPONINI 0.05*    Microbiology Results  Results for orders placed or performed during the hospital encounter of 06/23/15  Urine culture     Status: Abnormal   Collection Time: 06/23/15 11:13 AM  Result Value Ref Range Status   Specimen Description URINE, RANDOM  Final   Special Requests NONE  Final   Culture >=100,000 COLONIES/mL ENTEROCOCCUS FAECALIS (A)  Final   Report Status 06/26/2015 FINAL  Final   Organism ID, Bacteria ENTEROCOCCUS FAECALIS (A)  Final      Susceptibility   Enterococcus faecalis - MIC*    AMPICILLIN <=2 SENSITIVE Sensitive     LEVOFLOXACIN 1 SENSITIVE Sensitive      NITROFURANTOIN <=16 SENSITIVE Sensitive     VANCOMYCIN 1 SENSITIVE Sensitive     * >=100,000 COLONIES/mL ENTEROCOCCUS FAECALIS  MRSA PCR Screening     Status: None   Collection Time: 06/23/15  6:24 PM  Result Value Ref Range Status   MRSA by PCR NEGATIVE NEGATIVE Final    Comment:        The GeneXpert MRSA Assay (FDA approved for NASAL specimens only), is one component of a comprehensive MRSA colonization surveillance program. It is not intended to diagnose MRSA infection nor to guide or monitor treatment for MRSA infections.     RADIOLOGY:  No results found.   Management plans discussed with the patient, family and they are in agreement.  CODE STATUS:     Code Status Orders        Start     Ordered   07/11/15 1715  Do not attempt resuscitation (DNR)   Continuous    Question Answer Comment  In the event of cardiac or respiratory ARREST Do not call a "code blue"   In the event of cardiac or respiratory ARREST Do not perform Intubation, CPR, defibrillation or ACLS   In the event of cardiac or respiratory ARREST Use medication by any route, position, wound care, and other measures to relive pain and suffering. May use oxygen, suction and manual treatment of airway obstruction as needed for comfort.      07/11/15 1714    Code Status History    Date Active Date Inactive Code Status Order ID Comments User Context   06/23/2015  2:39 PM 06/25/2015  5:59 PM DNR [id number]  Wyatt Haste, MD ED   05/29/2015  6:17 AM 05/31/2015  5:33 PM DNR [id number]  Arnaldo Natal, MD Inpatient    Advance Directive Documentation        Most Recent Value   Type of Advance Directive  Healthcare Power of Attorney, Living will   Pre-existing out of facility DNR order (yellow form or pink MOST form)  Yellow form placed in  chart (order not valid for inpatient use)   "MOST" Form in Place?        TOTAL TIME TAKING CARE OF THIS PATIENT: 31 minutes.    Sherylann Vangorden,  Mardi Mainland.D on 07/14/2015 at 9:41  AM  Between 7am to 6pm - Pager - [phone]  After 6pm go to [email or web address] - Social research officer, government  [facility]  Office  [phone]  CC: Primary care physician; Ginette Otto, MD

## 2015-07-14 NOTE — Clinical Social Work Placement (Signed)
   CLINICAL SOCIAL WORK PLACEMENT  NOTE  Date:  07/14/2015  Patient Details  Name: [name] MRN: [id number] Date of Birth: 09-Feb-1921  Clinical Social Work is seeking post-discharge placement for this patient at the   level of care (*CSW will initial, date and re-position this form in  chart as items are completed):  Yes   Patient/family provided with [facility] Clinical Social Work Department's list of facilities offering this level of care within the geographic area requested by the patient (or if unable, by the patient's family).  Yes   Patient/family informed of their freedom to choose among providers that offer the needed level of care, that participate in Medicare, Medicaid or managed care program needed by the patient, have an available bed and are willing to accept the patient.  Yes   Patient/family informed of [facility]'s ownership interest in [facility] and [facility], as well as of the fact that they are under no obligation to receive care at these facilities.  PASRR submitted to EDS on 07/14/15     PASRR number received on 07/14/15     Existing PASRR number confirmed on       FL2 transmitted to all facilities in geographic area requested by pt/family on 07/14/15     FL2 transmitted to all facilities within larger geographic area on       Patient informed that his/her managed care company has contracts with or will negotiate with certain facilities, including the following:        Yes   Patient/family informed of bed offers received.  Patient chooses bed at [facility]     Physician recommends and patient chooses bed at  [facility](SNF)    Patient to be transferred to [facility] on 07/14/15.  Patient to be transferred to facility by [facility]     Patient family notified on 07/14/15 of transfer.  Name of family member notified:  [name], [name]     PHYSICIAN       Additional Comment:     _______________________________________________ Dede QuerySarah Kyelle Urbas, LCSW 07/14/2015, 4:07 PM

## 2015-09-19 ENCOUNTER — Emergency Department
Admission: EM | Admit: 2015-09-19 | Discharge: 2015-09-19 | Disposition: A | Discharge status: Home / Self Care | Payer: Medicare Other | Attending: Emergency Medicine | Admitting: Emergency Medicine

## 2015-09-19 DIAGNOSIS — I959 Hypotension, unspecified: Secondary | ICD-10-CM | POA: Diagnosis present

## 2015-09-19 DIAGNOSIS — I10 Essential (primary) hypertension: Secondary | ICD-10-CM | POA: Diagnosis not present

## 2015-09-19 DIAGNOSIS — R400 Somnolence: Secondary | ICD-10-CM | POA: Diagnosis not present

## 2015-09-19 DIAGNOSIS — I252 Old myocardial infarction: Secondary | ICD-10-CM | POA: Diagnosis not present

## 2015-09-19 DIAGNOSIS — N390 Urinary tract infection, site not specified: Secondary | ICD-10-CM | POA: Diagnosis not present

## 2015-09-19 LAB — URINALYSIS COMPLETE WITH MICROSCOPIC (ARMC ONLY)
Bacteria, UA: NONE SEEN
Bilirubin Urine: NEGATIVE
Glucose, UA: NEGATIVE mg/dL
Hgb urine dipstick: NEGATIVE
Ketones, ur: NEGATIVE mg/dL
LEUKOCYTES UA: NEGATIVE
Nitrite: NEGATIVE
PH: 7 (ref 5.0–8.0)
PROTEIN: 100 mg/dL — AB
SPECIFIC GRAVITY, URINE: 1.009 (ref 1.005–1.030)
SQUAMOUS EPITHELIAL / LPF: NONE SEEN

## 2015-09-19 MED ORDER — CEPHALEXIN 500 MG PO CAPS: 500.0000 mg | ORAL_CAPSULE | Freq: Two times a day (BID) | ORAL | 0 refills | Status: DC

## 2015-09-19 MED ORDER — CEPHALEXIN 500 MG PO CAPS
500.0000 mg | ORAL_CAPSULE | Freq: Once | ORAL | Status: AC
  Administered 2015-09-19: 500 mg via ORAL
  Filled 2015-09-19: qty 1

## 2015-09-19 NOTE — ED Triage Notes (Signed)
Pt to ED via [facility] from [facility] c/o hypotension. Per EMS staff at [facility] reported pt asleep in her chair, unresponsive upon trying to awaken, hypotensive at 73/46. EMS reports at the scene pt alert and at baseline hx of UTI and Dementia. Pt in no acute distress at this time.

## 2015-09-19 NOTE — ED Provider Notes (Signed)
[facility] Emergency Department Provider Note  ____________________________________________  Time seen: Approximately 1:18 PM  I have reviewed the triage vital signs and the nursing notes.   HISTORY  Chief Complaint Difficulty waking up  Level 5 caveat:  Portions of the history and physical were unable to be obtained due to the patient's chronic dementia   HPI [name] is a 82 y.o. female sent to the ED from her nursing facility due to difficulty waking up today. The patient was taking a nap, and when they went to wake her for lunch, they found it difficult to arouse the patient. They called EMS. By time EMS arrived the patient was awake and at her baseline mental status. Patient denies any complaints at all. EMS reports they obtain an initial blood pressure that was on the low side about 80/50 as the patient was waking up, but since the blood pressure checks by them were within normal limits. The EMS reports that they have transported this patient multiple times they know her well and this is indeed her baseline mental status as they know it.     Past Medical History:  Diagnosis Date  . Anemia   . Arthritis   . Dementia   . Diabetes mellitus without complication (HCC)   . Essential (primary) hypertension   . Hyperlipidemia   . Old myocardial infarction   . Rhabdomyolysis      Patient Active Problem List   Diagnosis Date Noted  . ARF (acute renal failure) (HCC) 07/11/2015  . UTI (lower urinary tract infection) 06/23/2015  . Cholecystitis 05/29/2015  . Pressure ulcer 05/29/2015  . Old myocardial infarction 08/29/2013  . Essential hypertension 08/29/2013  . Hyperlipidemia 08/29/2013     Past Surgical History:  Procedure Laterality Date  . 2000- hip replacement    . APPENDECTOMY    . broke femur  2015   right  . CESAREAN SECTION    . Left hip surgery    . right arm surgery- bone grafts and 3 plates Left 1610     Prior to Admission  medications   Medication Sig Start Date End Date Taking? Authorizing Provider  acitretin (SORIATANE) 25 MG capsule Take 25 mg by mouth See admin instructions. Takes 25mg  five times weekly (Monday-Friday)    Historical Provider, MD  amLODipine (NORVASC) 10 MG tablet Take 1 tablet (10 mg total) by mouth daily. 07/14/15   Wyatt Haste, MD  amLODipine (NORVASC) 10 MG tablet Take 1 tablet (10 mg total) by mouth daily. 07/14/15   Wyatt Haste, MD  calcium carbonate (OS-CAL) 600 MG TABS tablet Take 600 mg by mouth 2 (two) times daily with a meal.    Historical Provider, MD  cephALEXin (KEFLEX) 500 MG capsule Take 1 capsule (500 mg total) by mouth 2 (two) times daily. 09/19/15   Sharman Cheek, MD  Cholecalciferol (VITAMIN D) 2000 UNITS CAPS Take 2,000 Units by mouth daily.    Historical Provider, MD  docusate sodium (COLACE) 100 MG capsule Take 1 capsule (100 mg total) by mouth 2 (two) times daily. Hold for > 2 bowel movements in a day 05/31/15   Enid Baas, MD  donepezil (ARICEPT) 5 MG tablet Take 5 mg by mouth at bedtime.  09/27/14   Historical Provider, MD  feeding supplement, ENSURE ENLIVE, (ENSURE ENLIVE) LIQD Take 237 mLs by mouth 3 (three) times daily between meals. 07/14/15   Wyatt Haste, MD  isosorbide mononitrate (IMDUR) 30 MG 24 hr tablet Take 1  tablet (30 mg total) by mouth daily. 05/31/15   Enid Baasadhika Kalisetti, MD  metoprolol (LOPRESSOR) 50 MG tablet Take 1 tablet (50 mg total) by mouth 2 (two) times daily. 05/31/15   Enid Baasadhika Kalisetti, MD  mirtazapine (REMERON) 15 MG tablet Take 15 mg by mouth at bedtime.  09/27/14   Historical Provider, MD  ondansetron (ZOFRAN ODT) 4 MG disintegrating tablet Take 1 tablet (4 mg total) by mouth every 8 (eight) hours as needed for nausea or vomiting. 05/31/15   Enid Baasadhika Kalisetti, MD  ranitidine (ZANTAC) 150 MG tablet Take 150 mg by mouth at bedtime.    Historical Provider, MD  shark liver oil-cocoa butter (PREPARATION H) 0.25-3-85.5 % suppository Place 1  suppository rectally 4 (four) times daily as needed for hemorrhoids.    Historical Provider, MD  triamcinolone cream (KENALOG) 0.1 % Apply 1 application topically 2 (two) times daily.    Historical Provider, MD  vitamin B-12 (CYANOCOBALAMIN) 1000 MCG tablet Take 2,000 mcg by mouth daily.    Historical Provider, MD     Allergies Codeine and Sulfa antibiotics   Family History  Problem Relation Age of Onset  . CAD Father   . Prostate cancer Brother     Social History Social History  Substance Use Topics  . Smoking status: Never Smoker  . Smokeless tobacco: Never Used  . Alcohol use No    Review of Systems  Constitutional:   No fever ENT:   No sore throat. No rhinorrhea. Cardiovascular:   No chest pain. Respiratory:   No dyspnea or cough. Gastrointestinal:   Negative for abdominal pain  Genitourinary:   Negative for dysuria. 10-point ROS otherwise negative.  ____________________________________________   PHYSICAL EXAM:  VITAL SIGNS: ED Triage Vitals  Enc Vitals Group     BP 123/60      Pulse 50      Resp      Temp 97.6      Temp src      SpO2 97% on room air      Weight      Height      Head Circumference      Peak Flow      Pain Score      Pain Loc      Pain Edu?      Excl. in GC?     Vital signs reviewed, nursing assessments reviewed.   Constitutional:   Alert and orientedTo person. Well appearing and in no distress. Eyes:   No scleral icterus. No conjunctival pallor. PERRL. EOMI.  No nystagmus. ENT   Head:   Normocephalic and atraumatic.   Nose:   No congestion/rhinnorhea. No septal hematoma   Mouth/Throat:   MMM, no pharyngeal erythema. No peritonsillar mass.    Neck:   No stridor. No SubQ emphysema. No meningismus. Hematological/Lymphatic/Immunilogical:   No cervical lymphadenopathy. Cardiovascular:   RRR. Symmetric bilateral radial and DP pulses.  No murmurs.  Respiratory:   Normal respiratory effort without tachypnea nor  retractions. Breath sounds are clear and equal bilaterally. No wheezes/rales/rhonchi. Gastrointestinal:   Soft and nontender. Non distended. There is no CVA tenderness.  No rebound, rigidity, or guarding. Genitourinary:   deferred Musculoskeletal:   Nontender with normal range of motion in all extremities. No joint effusions.  No lower extremity tenderness.  No edema. Neurologic:   Normal speech and language.  CN 2-10 normal. Motor grossly intact. No gross focal neurologic deficits are appreciated.  Skin:    Skin is warm, dry and intact.  No rash noted.  No petechiae, purpura, or bullae.  ____________________________________________    LABS (pertinent positives/negatives) (all labs ordered are listed, but only abnormal results are displayed) Labs Reviewed  URINALYSIS COMPLETEWITH MICROSCOPIC ([facility] ONLY) - Abnormal; Notable for the following:       Result Value   Color, Urine STRAW (*)    APPearance CLEAR (*)    Protein, ur 100 (*)    All other components within normal limits  URINE CULTURE   ____________________________________________   EKG  Interpreted by me Sinus rhythm rate of 58, left axis, normal intervals. Poor R-wave progression in anterior precordial leads. Normal ST segments and T waves. Voltage criteria for LVH in the high lateral leads.  ____________________________________________    RADIOLOGY    ____________________________________________   PROCEDURES Procedures  ____________________________________________   INITIAL IMPRESSION / ASSESSMENT AND PLAN / ED COURSE  Pertinent labs & imaging results that were available during my care of the patient were reviewed by me and considered in my medical decision making (see chart for details).  Patient well appearing no acute distress. At baseline mental status. Had one possibly low blood pressure measurement but every other member blood pressure measurement has been normal without any medication or fluid  administration. Patient denies any symptoms. She is apparently very comfortable. We'll check urinalysis and EKG, give her something to eat, and plan for discharge home with outpatient follow-up.     Clinical Course  Value Comment By Time  WBC, UA: 6-30 Urinalysis shows 6-30 white blood cells but otherwise unremarkable. Given limitations of history we will go ahead and treat presumptively with Keflex. Follow up with primary care. Sharman Cheek, MD 08/12 1428   ____________________________________________   FINAL CLINICAL IMPRESSION(S) / ED DIAGNOSES  Final diagnoses:  Somnolence  UTI (lower urinary tract infection)       Portions of this note were generated with dragon dictation software. Dictation errors may occur despite best attempts at proofreading.    Sharman Cheek, MD 09/19/15 1451

## 2015-09-20 LAB — URINE CULTURE: CULTURE: NO GROWTH

## 2015-10-01 ENCOUNTER — Emergency Department
Admission: EM | Admit: 2015-10-01 | Discharge: 2015-10-02 | Disposition: A | Discharge status: Home / Self Care | Payer: Medicare Other | Attending: Emergency Medicine | Admitting: Emergency Medicine

## 2015-10-01 DIAGNOSIS — I10 Essential (primary) hypertension: Secondary | ICD-10-CM | POA: Diagnosis present

## 2015-10-01 DIAGNOSIS — E119 Type 2 diabetes mellitus without complications: Secondary | ICD-10-CM | POA: Insufficient documentation

## 2015-10-01 DIAGNOSIS — I252 Old myocardial infarction: Secondary | ICD-10-CM | POA: Insufficient documentation

## 2015-10-01 DIAGNOSIS — Z79899 Other long term (current) drug therapy: Secondary | ICD-10-CM | POA: Insufficient documentation

## 2015-10-01 DIAGNOSIS — R030 Elevated blood-pressure reading, without diagnosis of hypertension: Secondary | ICD-10-CM

## 2015-10-01 DIAGNOSIS — IMO0001 Reserved for inherently not codable concepts without codable children: Secondary | ICD-10-CM

## 2015-10-01 DIAGNOSIS — F0390 Unspecified dementia without behavioral disturbance: Secondary | ICD-10-CM | POA: Insufficient documentation

## 2015-10-01 LAB — CBC
HEMATOCRIT: 39.1 % (ref 35.0–47.0)
Hemoglobin: 13.2 g/dL (ref 12.0–16.0)
MCH: 28.9 pg (ref 26.0–34.0)
MCHC: 33.8 g/dL (ref 32.0–36.0)
MCV: 85.6 fL (ref 80.0–100.0)
PLATELETS: 185 10*3/uL (ref 150–440)
RBC: 4.56 MIL/uL (ref 3.80–5.20)
RDW: 15.5 % — AB (ref 11.5–14.5)
WBC: 6.4 10*3/uL (ref 3.6–11.0)

## 2015-10-01 LAB — BASIC METABOLIC PANEL
ANION GAP: 7 (ref 5–15)
BUN: 40 mg/dL — ABNORMAL HIGH (ref 6–20)
CALCIUM: 11.4 mg/dL — AB (ref 8.9–10.3)
CO2: 29 mmol/L (ref 22–32)
Chloride: 102 mmol/L (ref 101–111)
Creatinine, Ser: 3.6 mg/dL — ABNORMAL HIGH (ref 0.44–1.00)
GFR, EST AFRICAN AMERICAN: 12 mL/min — AB (ref >60)
GFR, EST NON AFRICAN AMERICAN: 10 mL/min — AB (ref >60)
Glucose, Bld: 95 mg/dL (ref 65–99)
Potassium: 4.3 mmol/L (ref 3.5–5.1)
SODIUM: 138 mmol/L (ref 135–145)

## 2015-10-01 LAB — TROPONIN I: TROPONIN I: 0.05 ng/mL — AB (ref <0.03)

## 2015-10-01 MED ORDER — AMLODIPINE BESYLATE 5 MG PO TABS
10.0000 mg | ORAL_TABLET | Freq: Once | ORAL | Status: AC
  Administered 2015-10-01: 10 mg via ORAL
  Filled 2015-10-01: qty 2

## 2015-10-01 MED ORDER — AMLODIPINE BESYLATE 10 MG PO TABS: 10.0000 mg | ORAL_TABLET | Freq: Every day | ORAL | 2 refills | Status: AC

## 2015-10-01 MED ORDER — SODIUM CHLORIDE 0.9 % IV BOLUS (SEPSIS)
1000.0000 mL | Freq: Once | INTRAVENOUS | Status: AC
  Administered 2015-10-01: 1000 mL via INTRAVENOUS

## 2015-10-01 MED ORDER — METOPROLOL TARTRATE 50 MG PO TABS
50.0000 mg | ORAL_TABLET | Freq: Once | ORAL | Status: AC
  Administered 2015-10-01: 50 mg via ORAL
  Filled 2015-10-01: qty 1

## 2015-10-01 NOTE — ED Provider Notes (Signed)
[facility] Emergency Department Provider Note  ____________________________________________  Time seen: Approximately 8:48 PM  I have reviewed the triage vital signs and the nursing notes.   HISTORY  Chief Complaint Hypertension  Level 5 caveat:  Portions of the history and physical were unable to be obtained due to dementia   HPI [name] is a 82 y.o. female with a history of diabetes, hypertension, coronary artery disease, dementia, anemia who presents from [facility] for elevated blood pressure. EMS was called and patient was noted to have elevated blood pressure the facility. Patient has no complaints at this time and denies headache, chest pain, shortness of breath, back pain, abdominal pain. Patient does not know where she is or why she is here which according to the facility is her baseline. Her only complaint is that she is hungry. Review of patient's medical record from the facility shows that patient's amlodipine has been discontinued on August 2 although last time patient was here she was told to continue amlodipine. She has also not received her metoprolol this evening.  Past Medical History:  Diagnosis Date  . Anemia   . Arthritis   . Dementia   . Diabetes mellitus without complication (HCC)   . Essential (primary) hypertension   . Hyperlipidemia   . Old myocardial infarction   . Rhabdomyolysis     Patient Active Problem List   Diagnosis Date Noted  . ARF (acute renal failure) (HCC) 07/11/2015  . UTI (lower urinary tract infection) 06/23/2015  . Cholecystitis 05/29/2015  . Pressure ulcer 05/29/2015  . Old myocardial infarction 08/29/2013  . Essential hypertension 08/29/2013  . Hyperlipidemia 08/29/2013    Past Surgical History:  Procedure Laterality Date  . 2000- hip replacement    . APPENDECTOMY    . broke femur  2015   right  . CESAREAN SECTION    . Left hip surgery    . right arm surgery- bone grafts and 3 plates  Left 1610    Prior to Admission medications   Medication Sig Start Date End Date Taking? Authorizing Provider  acetaminophen (TYLENOL) 500 MG tablet Take 500 mg by mouth every 4 (four) hours as needed for mild pain, fever or headache.   Yes Historical Provider, MD  acitretin (SORIATANE) 25 MG capsule Take 25 mg by mouth See admin instructions. Takes 25mg  five times weekly (Monday-Friday)   Yes Historical Provider, MD  alum & mag hydroxide-simeth (MAALOX/MYLANTA) 200-200-20 MG/5ML suspension Take 30 mLs by mouth every 6 (six) hours as needed for indigestion or heartburn.   Yes Historical Provider, MD  ARIPiprazole (ABILIFY) 2 MG tablet Take 2 mg by mouth daily.   Yes Historical Provider, MD  calcium carbonate (OS-CAL) 600 MG TABS tablet Take 600 mg by mouth 2 (two) times daily with a meal.   Yes Historical Provider, MD  Cholecalciferol (VITAMIN D) 2000 UNITS CAPS Take 2,000 Units by mouth daily.   Yes Historical Provider, MD  docusate sodium (COLACE) 100 MG capsule Take 1 capsule (100 mg total) by mouth 2 (two) times daily. Hold for > 2 bowel movements in a day 05/31/15  Yes Enid Baas, MD  donepezil (ARICEPT) 10 MG tablet Take 10 mg by mouth at bedtime.   Yes Historical Provider, MD  guaifenesin (ROBITUSSIN) 100 MG/5ML syrup Take 200 mg by mouth 4 (four) times daily as needed for cough.   Yes Historical Provider, MD  isosorbide mononitrate (IMDUR) 30 MG 24 hr tablet Take 1 tablet (30 mg total)  by mouth daily. 05/31/15  Yes Enid Baasadhika Kalisetti, MD  loperamide (IMODIUM) 2 MG capsule Take 2 mg by mouth as needed for diarrhea or loose stools.   Yes Historical Provider, MD  magnesium hydroxide (MILK OF MAGNESIA) 400 MG/5ML suspension Take 30 mLs by mouth at bedtime as needed for mild constipation.   Yes Historical Provider, MD  metoprolol (LOPRESSOR) 50 MG tablet Take 1 tablet (50 mg total) by mouth 2 (two) times daily. 05/31/15  Yes Enid Baasadhika Kalisetti, MD  neomycin-bacitracin-polymyxin (NEOSPORIN)  [phone] ointment Apply 1 application topically as needed (wound care).   Yes Historical Provider, MD  ondansetron (ZOFRAN) 4 MG tablet Take 4 mg by mouth every 8 (eight) hours as needed for nausea or vomiting.   Yes Historical Provider, MD  prednisoLONE 5 MG TABS tablet Take 5 mg by mouth daily. 09/30/15 10/06/15 Yes Historical Provider, MD  ranitidine (ZANTAC) 150 MG tablet Take 150 mg by mouth at bedtime.   Yes Historical Provider, MD  sertraline (ZOLOFT) 50 MG tablet Take 50 mg by mouth daily.   Yes Historical Provider, MD  shark liver oil-cocoa butter (PREPARATION H) 0.25-3-85.5 % suppository Place 1 suppository rectally every 4 (four) hours as needed for hemorrhoids.    Yes Historical Provider, MD  amLODipine (NORVASC) 10 MG tablet Take 1 tablet (10 mg total) by mouth daily. 10/01/15 09/30/16  Nita Sicklearolina Domonik Levario, MD    Allergies Codeine and Sulfa antibiotics  Family History  Problem Relation Age of Onset  . CAD Father   . Prostate cancer Brother     Social History Social History  Substance Use Topics  . Smoking status: Never Smoker  . Smokeless tobacco: Never Used  . Alcohol use No    Review of Systems  Constitutional: Negative for fever. Eyes: Negative for visual changes. ENT: Negative for sore throat. Cardiovascular: Negative for chest pain. Respiratory: Negative for shortness of breath. Gastrointestinal: Negative for abdominal pain, vomiting or diarrhea. Genitourinary: Negative for dysuria. Musculoskeletal: Negative for back pain. Skin: Negative for rash. Neurological: Negative for headaches, weakness or numbness.  ____________________________________________   PHYSICAL EXAM:  VITAL SIGNS: ED Triage Vitals  Enc Vitals Group     BP 10/01/15 2029 (!) 206/91     Pulse Rate 10/01/15 2029 72     Resp 10/01/15 2029 16     Temp 10/01/15 2029 98.7 F (37.1 C)     Temp Source 10/01/15 2029 Oral     SpO2 10/01/15 2029 98 %     Weight 10/01/15 2025 112 lb (50.8 kg)      Height 10/01/15 2025 5\' 2"  (1.575 m)     Head Circumference --      Peak Flow --      Pain Score --      Pain Loc --      Pain Edu? --      Excl. in GC? --     Constitutional: Alert and oriented x 1(baseline). Well appearing and in no apparent distress. HEENT:      Head: Normocephalic and atraumatic.         Eyes: Conjunctivae are normal. Sclera is non-icteric. EOMI. PERRL      Mouth/Throat: Mucous membranes are moist.       Neck: Supple with no signs of meningismus. Cardiovascular: Regular rate and rhythm. No murmurs, gallops, or rubs. 2+ symmetrical distal pulses are present in all extremities. No JVD. Respiratory: Normal respiratory effort. Lungs are clear to auscultation bilaterally. No wheezes, crackles, or rhonchi.  Gastrointestinal: Soft,  non tender, and non distended with positive bowel sounds. No rebound or guarding. Musculoskeletal: Nontender with normal range of motion in all extremities. No edema, cyanosis, or erythema of extremities. Neurologic: Normal speech and language. Face is symmetric. Moving all extremities. No gross focal neurologic deficits are appreciated. Skin: Skin is warm, dry and intact. No rash noted. Psychiatric: Mood and affect are normal. Speech and behavior are normal.  ____________________________________________   LABS (all labs ordered are listed, but only abnormal results are displayed)  Labs Reviewed  CBC - Abnormal; Notable for the following:       Result Value   RDW 15.5 (*)    All other components within normal limits  BASIC METABOLIC PANEL - Abnormal; Notable for the following:    BUN 40 (*)    Creatinine, Ser 3.60 (*)    Calcium 11.4 (*)    GFR calc non Af Amer 10 (*)    GFR calc Af Amer 12 (*)    All other components within normal limits  TROPONIN I - Abnormal; Notable for the following:    Troponin I 0.05 (*)    All other components within normal limits   ____________________________________________  EKG  ED ECG REPORT I,  Nita Sickle, the attending physician, personally viewed and interpreted this ECG.  Normal sinus rhythm, rate of 61, normal intervals, normal axis, T-wave inversions on one in aVL and flattening on remaining lateral leads, no ST elevations. Unchanged from prior  ____________________________________________  RADIOLOGY  none ____________________________________________   PROCEDURES  Procedure(s) performed: None Procedures Critical Care performed:  None ____________________________________________   INITIAL IMPRESSION / ASSESSMENT AND PLAN / ED COURSE  82 y.o. female with a history of diabetes, hypertension, coronary artery disease, dementia, anemia who presents from [facility] for elevated blood pressure. Patient is asymptomatic. Review of patient's medical record from [facility] shows the patient has been discontinued from her amlodipine 18 days ago and has not received her evening dose of metoprolol. Patient is neurologically intact. Her EKG is unchanged from prior. We'll giver her nightly dose of metoprolol and 10 of amlodipine, we'll check kidney function and troponin as patient has a history of dementia. Anticipate discharge back to the facility.  Clinical Course  Comment By Time  BP improved with PO meds. Troponin mildly elevated which is patient's baseline. Creatinine 3.60 (baseline is 2.8). However GFR not much changed from 13-> 10. I will give IVF and make sure patient is making urine here. This could also be due to the fact that patient has not eaten or drank anything since lunch today. I do feel that as long as she has no symptoms and she is making urine, I will plan to DC her and have labs re-check at SNF. Nita Sickle, MD 08/24 2214    Pertinent labs & imaging results that were available during my care of the patient were reviewed by me and considered in my medical decision making (see chart for  details).    ____________________________________________   FINAL CLINICAL IMPRESSION(S) / ED DIAGNOSES  Final diagnoses:  Elevated blood pressure      NEW MEDICATIONS STARTED DURING THIS VISIT:  New Prescriptions   AMLODIPINE (NORVASC) 10 MG TABLET    Take 1 tablet (10 mg total) by mouth daily.     Note:  This document was prepared using Dragon voice recognition software and may include unintentional dictation errors.    Nita Sickle, MD 10/02/15 1019

## 2015-10-01 NOTE — ED Triage Notes (Signed)
Per [facility] bp high - dnr at bedside

## 2015-10-01 NOTE — ED Notes (Signed)
Pt ate a whole banana, 4 oz of whole milk, 6 oz water.

## 2015-10-01 NOTE — ED Notes (Signed)
Pt ate a container of ice cream and 3 oz of milk

## 2015-10-01 NOTE — Discharge Instructions (Addendum)
Patient with mildly elevated kidney creatinine. Received IV fluids and was making urine in the ED. Needs to have creatinine re-check in 24-48 hours by SNF. Re-start amlodipine 10mg  QD and continue all other home meds. Return to the ER for chest pain, shortness of breath, abdominal pain, or any new symptoms.

## 2015-10-01 NOTE — ED Notes (Signed)
Pt complaining of being hungry. Per ems the [facility] had said the pt has been refusing to eat. Food obtained - pt has no teeth and needs to be fed.

## 2015-10-02 NOTE — ED Notes (Signed)
Sherilyn CooterHenry RN called [facility] and talked to Debora One and gave report about the Pt. She stated that they have no means to come and pick up the Pt therefore the Pt will [facility] via EMS.

## 2015-10-02 NOTE — ED Provider Notes (Signed)
-----------------------------------------   12:23 AM on 10/02/2015 -----------------------------------------  Patient urinated large amount in diaper. Discharge back to facility per Dr. Arbutus LeasVeronese's plan.   Irean HongJade J Brittan Butterbaugh, MD 10/02/15 [phone]

## 2016-01-02 ENCOUNTER — Emergency Department: Payer: Medicare Other

## 2016-01-02 ENCOUNTER — Encounter: Payer: Self-pay | Admitting: Emergency Medicine

## 2016-01-02 ENCOUNTER — Emergency Department
Admission: EM | Admit: 2016-01-02 | Discharge: 2016-01-02 | Disposition: A | Discharge status: Home / Self Care | Payer: Medicare Other | Attending: Student in an Organized Health Care Education/Training Program | Admitting: Student in an Organized Health Care Education/Training Program

## 2016-01-02 DIAGNOSIS — S0990XA Unspecified injury of head, initial encounter: Secondary | ICD-10-CM | POA: Diagnosis present

## 2016-01-02 DIAGNOSIS — Y9389 Activity, other specified: Secondary | ICD-10-CM | POA: Insufficient documentation

## 2016-01-02 DIAGNOSIS — W06XXXA Fall from bed, initial encounter: Secondary | ICD-10-CM | POA: Diagnosis not present

## 2016-01-02 DIAGNOSIS — I10 Essential (primary) hypertension: Secondary | ICD-10-CM | POA: Insufficient documentation

## 2016-01-02 DIAGNOSIS — Y999 Unspecified external cause status: Secondary | ICD-10-CM | POA: Insufficient documentation

## 2016-01-02 DIAGNOSIS — I252 Old myocardial infarction: Secondary | ICD-10-CM | POA: Insufficient documentation

## 2016-01-02 DIAGNOSIS — Z79899 Other long term (current) drug therapy: Secondary | ICD-10-CM | POA: Diagnosis not present

## 2016-01-02 DIAGNOSIS — Y929 Unspecified place or not applicable: Secondary | ICD-10-CM | POA: Insufficient documentation

## 2016-01-02 DIAGNOSIS — S0003XA Contusion of scalp, initial encounter: Secondary | ICD-10-CM | POA: Insufficient documentation

## 2016-01-02 DIAGNOSIS — E119 Type 2 diabetes mellitus without complications: Secondary | ICD-10-CM | POA: Insufficient documentation

## 2016-01-02 LAB — URINALYSIS COMPLETE WITH MICROSCOPIC (ARMC ONLY)
Bilirubin Urine: NEGATIVE
GLUCOSE, UA: NEGATIVE mg/dL
HGB URINE DIPSTICK: NEGATIVE
Ketones, ur: NEGATIVE mg/dL
LEUKOCYTES UA: NEGATIVE
Nitrite: NEGATIVE
PH: 8 (ref 5.0–8.0)
Protein, ur: 100 mg/dL — AB
SQUAMOUS EPITHELIAL / LPF: NONE SEEN
Specific Gravity, Urine: 1.006 (ref 1.005–1.030)

## 2016-01-02 LAB — COMPREHENSIVE METABOLIC PANEL WITH GFR
ALT: 11 U/L — ABNORMAL LOW (ref 14–54)
AST: 18 U/L (ref 15–41)
Albumin: 3.6 g/dL (ref 3.5–5.0)
Alkaline Phosphatase: 46 U/L (ref 38–126)
Anion gap: 8 (ref 5–15)
BUN: 19 mg/dL (ref 6–20)
CO2: 28 mmol/L (ref 22–32)
Calcium: 9.6 mg/dL (ref 8.9–10.3)
Chloride: 102 mmol/L (ref 101–111)
Creatinine, Ser: 2.05 mg/dL — ABNORMAL HIGH (ref 0.44–1.00)
GFR calc Af Amer: 23 mL/min — ABNORMAL LOW
GFR calc non Af Amer: 20 mL/min — ABNORMAL LOW
Glucose, Bld: 116 mg/dL — ABNORMAL HIGH (ref 65–99)
Potassium: 3.6 mmol/L (ref 3.5–5.1)
Sodium: 138 mmol/L (ref 135–145)
Total Bilirubin: 0.5 mg/dL (ref 0.3–1.2)
Total Protein: 6.9 g/dL (ref 6.5–8.1)

## 2016-01-02 LAB — CBC WITH DIFFERENTIAL/PLATELET
Basophils Absolute: 0 K/uL (ref 0–0.1)
Basophils Relative: 1 %
Eosinophils Absolute: 0.1 K/uL (ref 0–0.7)
Eosinophils Relative: 2 %
HCT: 38.1 % (ref 35.0–47.0)
Hemoglobin: 12.6 g/dL (ref 12.0–16.0)
Lymphocytes Relative: 16 %
Lymphs Abs: 1 K/uL (ref 1.0–3.6)
MCH: 27.7 pg (ref 26.0–34.0)
MCHC: 33.1 g/dL (ref 32.0–36.0)
MCV: 83.5 fL (ref 80.0–100.0)
Monocytes Absolute: 0.4 K/uL (ref 0.2–0.9)
Monocytes Relative: 6 %
Neutro Abs: 4.9 K/uL (ref 1.4–6.5)
Neutrophils Relative %: 75 %
Platelets: 189 K/uL (ref 150–440)
RBC: 4.56 MIL/uL (ref 3.80–5.20)
RDW: 15.2 % — ABNORMAL HIGH (ref 11.5–14.5)
WBC: 6.4 K/uL (ref 3.6–11.0)

## 2016-01-02 LAB — TROPONIN I

## 2016-01-02 NOTE — ED Triage Notes (Signed)
Arrived via EMS from [facility].  Oriented to person and place.  Reports losing her balance and hit her head.  Denies LOC.  Hematoma to back of head.

## 2016-01-02 NOTE — ED Provider Notes (Signed)
[facility] Emergency Department Provider Note    First MD Initiated Contact with Patient 01/02/16 0805     (approximate)  I have reviewed the triage vital signs and the nursing notes.   HISTORY  Chief Complaint Fall    HPI Rockne MenghiniMolly Reicks is a 82 y.o. female resents from [facility] after an unwitnessed fall from standing. Patient states that she is trying to get out of bed and lost her balance and fell backwards hitting her head. She denies any chest pain or shortness of breath at this time. Denies any numbness or tingling. States she does have a mild headache located in the back of her head where she has associated swelling and contusion.  She arrives with DO NOT RESUSCITATE paperwork signed. She is not on any blood thinners.   Past Medical History:  Diagnosis Date  . Anemia   . Arthritis   . Dementia   . Diabetes mellitus without complication (HCC)   . Essential (primary) hypertension   . Hyperlipidemia   . Old myocardial infarction   . Rhabdomyolysis    Family History  Problem Relation Age of Onset  . CAD Father   . Prostate cancer Brother    Past Surgical History:  Procedure Laterality Date  . 2000- hip replacement    . APPENDECTOMY    . broke femur  2015   right  . CESAREAN SECTION    . Left hip surgery    . right arm surgery- bone grafts and 3 plates Left [id number]   Patient Active Problem List   Diagnosis Date Noted  . ARF (acute renal failure) (HCC) 07/11/2015  . UTI (lower urinary tract infection) 06/23/2015  . Cholecystitis 05/29/2015  . Pressure ulcer 05/29/2015  . Old myocardial infarction 08/29/2013  . Essential hypertension 08/29/2013  . Hyperlipidemia 08/29/2013      Prior to Admission medications   Medication Sig Start Date End Date Taking? Authorizing Provider  acetaminophen (TYLENOL) 500 MG tablet Take 500 mg by mouth every 4 (four) hours as needed for mild pain, fever or headache.    Historical Provider, MD    acitretin (SORIATANE) 25 MG capsule Take 25 mg by mouth See admin instructions. Takes 25mg  five times weekly (Monday-Friday)    Historical Provider, MD  alum & mag hydroxide-simeth (MAALOX/MYLANTA) 200-200-20 MG/5ML suspension Take 30 mLs by mouth every 6 (six) hours as needed for indigestion or heartburn.    Historical Provider, MD  amLODipine (NORVASC) 10 MG tablet Take 1 tablet (10 mg total) by mouth daily. 10/01/15 09/30/16  Nita Sicklearolina Veronese, MD  ARIPiprazole (ABILIFY) 2 MG tablet Take 2 mg by mouth daily.    Historical Provider, MD  calcium carbonate (OS-CAL) 600 MG TABS tablet Take 600 mg by mouth 2 (two) times daily with a meal.    Historical Provider, MD  Cholecalciferol (VITAMIN D) 2000 UNITS CAPS Take 2,000 Units by mouth daily.    Historical Provider, MD  docusate sodium (COLACE) 100 MG capsule Take 1 capsule (100 mg total) by mouth 2 (two) times daily. Hold for > 2 bowel movements in a day 05/31/15   Enid Baasadhika Kalisetti, MD  donepezil (ARICEPT) 10 MG tablet Take 10 mg by mouth at bedtime.    Historical Provider, MD  guaifenesin (ROBITUSSIN) 100 MG/5ML syrup Take 200 mg by mouth 4 (four) times daily as needed for cough.    Historical Provider, MD  isosorbide mononitrate (IMDUR) 30 MG 24 hr tablet Take 1 tablet (30 mg total) by  mouth daily. 05/31/15   Enid Baasadhika Kalisetti, MD  loperamide (IMODIUM) 2 MG capsule Take 2 mg by mouth as needed for diarrhea or loose stools.    Historical Provider, MD  magnesium hydroxide (MILK OF MAGNESIA) 400 MG/5ML suspension Take 30 mLs by mouth at bedtime as needed for mild constipation.    Historical Provider, MD  metoprolol (LOPRESSOR) 50 MG tablet Take 1 tablet (50 mg total) by mouth 2 (two) times daily. 05/31/15   Enid Baasadhika Kalisetti, MD  neomycin-bacitracin-polymyxin (NEOSPORIN) [phone] ointment Apply 1 application topically as needed (wound care).    Historical Provider, MD  ondansetron (ZOFRAN) 4 MG tablet Take 4 mg by mouth every 8 (eight) hours as needed for  nausea or vomiting.    Historical Provider, MD  ranitidine (ZANTAC) 150 MG tablet Take 150 mg by mouth at bedtime.    Historical Provider, MD  sertraline (ZOLOFT) 50 MG tablet Take 50 mg by mouth daily.    Historical Provider, MD  shark liver oil-cocoa butter (PREPARATION H) 0.25-3-85.5 % suppository Place 1 suppository rectally every 4 (four) hours as needed for hemorrhoids.     Historical Provider, MD    Allergies Codeine and Sulfa antibiotics    Social History Social History  Substance Use Topics  . Smoking status: Never Smoker  . Smokeless tobacco: Never Used  . Alcohol use No    Review of Systems Patient denies headaches, rhinorrhea, blurry vision, numbness, shortness of breath, chest pain, edema, cough, abdominal pain, nausea, vomiting, diarrhea, dysuria, fevers, rashes or hallucinations unless otherwise stated above in HPI. ____________________________________________   PHYSICAL EXAM:  VITAL SIGNS: Vitals:   01/02/16 0818 01/02/16 1130  BP: (!) 178/78 (!) 159/82  Pulse: 66 65  Resp: 16 14  Temp: 97.8 F (36.6 C)     Constitutional: Alert  in no acute distress. Eyes: Conjunctivae are normal. PERRL. EOMI. Head: large left occipital contusion and swelling, no battle sign or racoon eyes, no hemotypanum Nose: No congestion/rhinnorhea. Mouth/Throat: Mucous membranes are moist.  Oropharynx non-erythematous. Neck: No stridor. Painless ROM. No cervical spine tenderness to palpation Hematological/Lymphatic/Immunilogical: No cervical lymphadenopathy. Cardiovascular: Normal rate, regular rhythm. Grossly normal heart sounds.  Good peripheral circulation. Respiratory: Normal respiratory effort.  No retractions. Lungs CTAB. Gastrointestinal: Soft and nontender. No distention. No abdominal bruits. No CVA tenderness. Musculoskeletal: No lower extremity tenderness nor edema.  No joint effusions. Neurologic:  Normal speech and language. No gross focal neurologic deficits are  appreciated.  Skin:  Skin is warm, dry and intact. No rash noted.   ____________________________________________   LABS (all labs ordered are listed, but only abnormal results are displayed)  Results for orders placed or performed during the hospital encounter of 01/02/16 (from the past 24 hour(s))  CBC with Differential/Platelet     Status: Abnormal   Collection Time: 01/02/16  9:14 AM  Result Value Ref Range   WBC 6.4 3.6 - 11.0 K/uL   RBC 4.56 3.80 - 5.20 MIL/uL   Hemoglobin 12.6 12.0 - 16.0 g/dL   HCT 40.938.1 81.135.0 - 91.447.0 %   MCV 83.5 80.0 - 100.0 fL   MCH 27.7 26.0 - 34.0 pg   MCHC 33.1 32.0 - 36.0 g/dL   RDW 78.215.2 (H) 95.611.5 - 21.314.5 %   Platelets 189 150 - 440 K/uL   Neutrophils Relative % 75 %   Neutro Abs 4.9 1.4 - 6.5 K/uL   Lymphocytes Relative 16 %   Lymphs Abs 1.0 1.0 - 3.6 K/uL   Monocytes Relative 6 %  Monocytes Absolute 0.4 0.2 - 0.9 K/uL   Eosinophils Relative 2 %   Eosinophils Absolute 0.1 0 - 0.7 K/uL   Basophils Relative 1 %   Basophils Absolute 0.0 0 - 0.1 K/uL  Comprehensive metabolic panel     Status: Abnormal   Collection Time: 01/02/16  9:14 AM  Result Value Ref Range   Sodium 138 135 - 145 mmol/L   Potassium 3.6 3.5 - 5.1 mmol/L   Chloride 102 101 - 111 mmol/L   CO2 28 22 - 32 mmol/L   Glucose, Bld 116 (H) 65 - 99 mg/dL   BUN 19 6 - 20 mg/dL   Creatinine, Ser 8.11 (H) 0.44 - 1.00 mg/dL   Calcium 9.6 8.9 - 91.4 mg/dL   Total Protein 6.9 6.5 - 8.1 g/dL   Albumin 3.6 3.5 - 5.0 g/dL   AST 18 15 - 41 U/L   ALT 11 (L) 14 - 54 U/L   Alkaline Phosphatase 46 38 - 126 U/L   Total Bilirubin 0.5 0.3 - 1.2 mg/dL   GFR calc non Af Amer 20 (L) >60 mL/min   GFR calc Af Amer 23 (L) >60 mL/min   Anion gap 8 5 - 15  Troponin I     Status: None   Collection Time: 01/02/16  9:14 AM  Result Value Ref Range   Troponin I <0.03 <0.03 ng/mL  Urinalysis complete, with microscopic ([facility] only)     Status: Abnormal   Collection Time: 01/02/16 12:10 PM  Result Value Ref  Range   Color, Urine STRAW (A) YELLOW   APPearance CLEAR (A) CLEAR   Glucose, UA NEGATIVE NEGATIVE mg/dL   Bilirubin Urine NEGATIVE NEGATIVE   Ketones, ur NEGATIVE NEGATIVE mg/dL   Specific Gravity, Urine 1.006 1.005 - 1.030   Hgb urine dipstick NEGATIVE NEGATIVE   pH 8.0 5.0 - 8.0   Protein, ur 100 (A) NEGATIVE mg/dL   Nitrite NEGATIVE NEGATIVE   Leukocytes, UA NEGATIVE NEGATIVE   RBC / HPF 0-5 0 - 5 RBC/hpf   WBC, UA 0-5 0 - 5 WBC/hpf   Bacteria, UA RARE (A) NONE SEEN   Squamous Epithelial / LPF NONE SEEN NONE SEEN   Mucous PRESENT    ____________________________________________  EKG My review and personal interpretation at Time: 9:08   Indication: fall  Rate: 70  Rhythm: sinus Axis: right  Other: non specific st changes, no acute ischemia ____________________________________________  RADIOLOGY  I personally reviewed all radiographic images ordered to evaluate for the above acute complaints and reviewed radiology reports and findings.  These findings were personally discussed with the patient.  Please see medical record for radiology report.  ____________________________________________   PROCEDURES  Procedure(s) performed: none Procedures    Critical Care performed: no ____________________________________________   INITIAL IMPRESSION / ASSESSMENT AND PLAN / ED COURSE  Pertinent labs & imaging results that were available during my care of the patient were reviewed by me and considered in my medical decision making (see chart for details).  DDX: sah, sdh, iph, contusion, tbi, electrolyte abn  Emmaly Leech is a 82 y.o. who presents to the ED with acute head injury after of fall from standing. Patient is AFVSS in ED. Exam as above. Given current presentation have considered the above differential.  Will order CT and radiographic and laboratory evaluation due to concern for underlying metabolic process that could predispose her to fall. History seems more consistent  with mechanical fall.  The patient will be placed on continuous pulse  oximetry and telemetry for monitoring.  Laboratory evaluation will be sent to evaluate for the above complaints.     Clinical Course as of Jan 02 1307  Sat Jan 02, 2016  1248 UA no evidence of infection. Blood work otherwise at baseline. No evidence of acidosis or leukocytosis.  [PR]  1257 CT imaging with no evidence of acute intracranial injury. Patient otherwise at baseline and hemodynamic stable. Appropriate for discharge back to facility.  Have discussed with the patient and available family all diagnostics and treatments performed thus far and all questions were answered to the best of my ability. The patient demonstrates understanding and agreement with plan.   [PR]    Clinical Course User Index [PR] Willy Eddy, MD     ____________________________________________   FINAL CLINICAL IMPRESSION(S) / ED DIAGNOSES  Final diagnoses:  Injury of head, initial encounter  Contusion of scalp, initial encounter      NEW MEDICATIONS STARTED DURING THIS VISIT:  New Prescriptions   No medications on file     Note:  This document was prepared using Dragon voice recognition software and may include unintentional dictation errors.    Willy Eddy, MD 01/02/16 [phone]

## 2016-01-02 NOTE — Discharge Instructions (Signed)
Please follow-up PCP. Return to ER should he have worsening confusion, headache, numbness or tingling, any other concerns.

## 2016-01-02 NOTE — ED Notes (Signed)
Reports losing her balance and slipped into the breakfast table, hit head, hematoma to back of head.  MAE, reports mild pain. No other injuries noted.

## 2016-01-11 ENCOUNTER — Emergency Department
Admission: EM | Admit: 2016-01-11 | Discharge: 2016-01-12 | Disposition: A | Discharge status: Home / Self Care | Payer: Medicare Other | Attending: Emergency Medicine | Admitting: Emergency Medicine

## 2016-01-11 ENCOUNTER — Encounter: Payer: Self-pay | Admitting: Occupational Medicine

## 2016-01-11 ENCOUNTER — Emergency Department: Payer: Medicare Other

## 2016-01-11 DIAGNOSIS — S0990XA Unspecified injury of head, initial encounter: Secondary | ICD-10-CM

## 2016-01-11 DIAGNOSIS — S0181XA Laceration without foreign body of other part of head, initial encounter: Secondary | ICD-10-CM | POA: Insufficient documentation

## 2016-01-11 DIAGNOSIS — N390 Urinary tract infection, site not specified: Secondary | ICD-10-CM | POA: Insufficient documentation

## 2016-01-11 DIAGNOSIS — Z79899 Other long term (current) drug therapy: Secondary | ICD-10-CM | POA: Insufficient documentation

## 2016-01-11 DIAGNOSIS — F0390 Unspecified dementia without behavioral disturbance: Secondary | ICD-10-CM | POA: Diagnosis not present

## 2016-01-11 DIAGNOSIS — Y939 Activity, unspecified: Secondary | ICD-10-CM | POA: Diagnosis not present

## 2016-01-11 DIAGNOSIS — Y929 Unspecified place or not applicable: Secondary | ICD-10-CM | POA: Insufficient documentation

## 2016-01-11 DIAGNOSIS — W1839XA Other fall on same level, initial encounter: Secondary | ICD-10-CM | POA: Insufficient documentation

## 2016-01-11 DIAGNOSIS — I10 Essential (primary) hypertension: Secondary | ICD-10-CM | POA: Diagnosis not present

## 2016-01-11 DIAGNOSIS — E119 Type 2 diabetes mellitus without complications: Secondary | ICD-10-CM | POA: Diagnosis not present

## 2016-01-11 DIAGNOSIS — S0101XA Laceration without foreign body of scalp, initial encounter: Secondary | ICD-10-CM

## 2016-01-11 DIAGNOSIS — Y999 Unspecified external cause status: Secondary | ICD-10-CM | POA: Insufficient documentation

## 2016-01-11 LAB — BASIC METABOLIC PANEL
Anion gap: 10 (ref 5–15)
BUN: 23 mg/dL — AB (ref 6–20)
CALCIUM: 10.1 mg/dL (ref 8.9–10.3)
CO2: 29 mmol/L (ref 22–32)
CREATININE: 2.1 mg/dL — AB (ref 0.44–1.00)
Chloride: 99 mmol/L — ABNORMAL LOW (ref 101–111)
GFR calc non Af Amer: 19 mL/min — ABNORMAL LOW (ref >60)
GFR, EST AFRICAN AMERICAN: 22 mL/min — AB (ref >60)
Glucose, Bld: 158 mg/dL — ABNORMAL HIGH (ref 65–99)
Potassium: 3.1 mmol/L — ABNORMAL LOW (ref 3.5–5.1)
SODIUM: 138 mmol/L (ref 135–145)

## 2016-01-11 LAB — CBC WITH DIFFERENTIAL/PLATELET
BASOS PCT: 1 %
Basophils Absolute: 0 10*3/uL (ref 0–0.1)
EOS ABS: 0.1 10*3/uL (ref 0–0.7)
EOS PCT: 1 %
HCT: 36.6 % (ref 35.0–47.0)
Hemoglobin: 12.6 g/dL (ref 12.0–16.0)
Lymphocytes Relative: 18 %
Lymphs Abs: 1.6 10*3/uL (ref 1.0–3.6)
MCH: 28.3 pg (ref 26.0–34.0)
MCHC: 34.4 g/dL (ref 32.0–36.0)
MCV: 82.4 fL (ref 80.0–100.0)
MONO ABS: 0.5 10*3/uL (ref 0.2–0.9)
MONOS PCT: 6 %
NEUTROS PCT: 74 %
Neutro Abs: 6.2 10*3/uL (ref 1.4–6.5)
PLATELETS: 220 10*3/uL (ref 150–440)
RBC: 4.45 MIL/uL (ref 3.80–5.20)
RDW: 15.2 % — AB (ref 11.5–14.5)
WBC: 8.5 10*3/uL (ref 3.6–11.0)

## 2016-01-11 LAB — TROPONIN I

## 2016-01-11 MED ORDER — LIDOCAINE HCL (PF) 1 % IJ SOLN
INTRAMUSCULAR | Status: AC
  Filled 2016-01-11: qty 5

## 2016-01-11 MED ORDER — ONDANSETRON HCL 4 MG/2ML IJ SOLN
INTRAMUSCULAR | Status: AC
  Administered 2016-01-11: 4 mg via INTRAVENOUS
  Filled 2016-01-11: qty 2

## 2016-01-11 MED ORDER — ONDANSETRON HCL 4 MG/2ML IJ SOLN
4.0000 mg | Freq: Once | INTRAMUSCULAR | Status: AC
  Administered 2016-01-11: 4 mg via INTRAVENOUS

## 2016-01-11 NOTE — ED Provider Notes (Signed)
[facility] Emergency Department Provider Note    ____________________________________________   I have reviewed the triage vital signs and the nursing notes.   HISTORY  Chief Complaint Fall (unwitness) and Head Laceration   History limited by: Dementia   HPI [name] is a 82 y.o. female who presents to the emergency department today from living facility after an unwitnessed fall. The patient does have a history of dementia and cannot give any significant history of the events that led to the fall. She is complaining of a headache and it did suffer a laceration and left side of her head. EMS denies that she is on blood thinners. Per nursing home paperwork she is not on any blood thinners. Head was wrapped by EMS.   Past Medical History:  Diagnosis Date  . Anemia   . Arthritis   . Dementia   . Diabetes mellitus without complication (HCC)   . Essential (primary) hypertension   . Hyperlipidemia   . Old myocardial infarction   . Rhabdomyolysis     Patient Active Problem List   Diagnosis Date Noted  . ARF (acute renal failure) (HCC) 07/11/2015  . UTI (lower urinary tract infection) 06/23/2015  . Cholecystitis 05/29/2015  . Pressure ulcer 05/29/2015  . Old myocardial infarction 08/29/2013  . Essential hypertension 08/29/2013  . Hyperlipidemia 08/29/2013    Past Surgical History:  Procedure Laterality Date  . 2000- hip replacement    . APPENDECTOMY    . broke femur  2015   right  . CESAREAN SECTION    . Left hip surgery    . right arm surgery- bone grafts and 3 plates Left 1610    Prior to Admission medications   Medication Sig Start Date End Date Taking? Authorizing Provider  acetaminophen (TYLENOL) 500 MG tablet Take 500 mg by mouth every 4 (four) hours as needed for mild pain, fever or headache.    Historical Provider, MD  alum & mag hydroxide-simeth (MAALOX/MYLANTA) 200-200-20 MG/5ML suspension Take 30 mLs by mouth every 6 (six) hours  as needed for indigestion or heartburn.    Historical Provider, MD  amLODipine (NORVASC) 10 MG tablet Take 1 tablet (10 mg total) by mouth daily. 10/01/15 09/30/16  Nita Sickle, MD  ARIPiprazole (ABILIFY) 2 MG tablet Take 2 mg by mouth daily.    Historical Provider, MD  calcium carbonate (OS-CAL) 600 MG TABS tablet Take 600 mg by mouth 2 (two) times daily with a meal.    Historical Provider, MD  Cholecalciferol (VITAMIN D) 2000 UNITS CAPS Take 2,000 Units by mouth daily.    Historical Provider, MD  docusate sodium (COLACE) 100 MG capsule Take 1 capsule (100 mg total) by mouth 2 (two) times daily. Hold for > 2 bowel movements in a day 05/31/15   Enid Baas, MD  donepezil (ARICEPT) 10 MG tablet Take 10 mg by mouth at bedtime.    Historical Provider, MD  guaifenesin (ROBITUSSIN) 100 MG/5ML syrup Take 200 mg by mouth 4 (four) times daily as needed for cough.    Historical Provider, MD  isosorbide mononitrate (IMDUR) 30 MG 24 hr tablet Take 1 tablet (30 mg total) by mouth daily. 05/31/15   Enid Baas, MD  loperamide (IMODIUM) 2 MG capsule Take 2 mg by mouth as needed for diarrhea or loose stools.    Historical Provider, MD  magnesium hydroxide (MILK OF MAGNESIA) 400 MG/5ML suspension Take 30 mLs by mouth at bedtime as needed for mild constipation.    Historical Provider,  MD  metoprolol (LOPRESSOR) 50 MG tablet Take 1 tablet (50 mg total) by mouth 2 (two) times daily. 05/31/15   Enid Baasadhika Kalisetti, MD  neomycin-bacitracin-polymyxin (NEOSPORIN) [phone] ointment Apply 1 application topically as needed (wound care).    Historical Provider, MD  ondansetron (ZOFRAN) 4 MG tablet Take 4 mg by mouth every 8 (eight) hours as needed for nausea or vomiting.    Historical Provider, MD  ranitidine (ZANTAC) 150 MG tablet Take 150 mg by mouth at bedtime.    Historical Provider, MD  sertraline (ZOLOFT) 50 MG tablet Take 50 mg by mouth daily.    Historical Provider, MD  shark liver oil-cocoa butter  (PREPARATION H) 0.25-3-85.5 % suppository Place 1 suppository rectally every 4 (four) hours as needed for hemorrhoids.     Historical Provider, MD    Allergies Codeine and Sulfa antibiotics  Family History  Problem Relation Age of Onset  . CAD Father   . Prostate cancer Brother     Social History Social History  Substance Use Topics  . Smoking status: Never Smoker  . Smokeless tobacco: Never Used  . Alcohol use No    Review of Systems  Constitutional: Negative for fever. Cardiovascular: Negative for chest pain. Respiratory: Negative for shortness of breath. Gastrointestinal: Negative for abdominal pain, vomiting and diarrhea. Skin: Positive for laceration to left side of head. Neurological: Positive for headache.  10-point ROS otherwise negative.  ____________________________________________   PHYSICAL EXAM:  VITAL SIGNS: ED Triage Vitals  Enc Vitals Group     BP 192/85     Pulse 61     Resp 16     Temp 97.4     Temp src      SpO2 95   Constitutional: Awake and alert. Not oriented, head wrapped in gauze. Eyes: Conjunctivae are normal. Normal extraocular movements. ENT   Head: Normocephalic. Roughly 3 cm laceration to left temple with small amount of bleeding.   Nose: No congestion/rhinnorhea.   Mouth/Throat: Mucous membranes are moist.   Neck: No stridor. No midline tenderness. Hematological/Lymphatic/Immunilogical: No cervical lymphadenopathy. Cardiovascular: Normal rate, regular rhythm.  No murmurs, rubs, or gallops.  Respiratory: Normal respiratory effort without tachypnea nor retractions. Breath sounds are clear and equal bilaterally. No wheezes/rales/rhonchi. Gastrointestinal: Soft and nontender. No distention.  Genitourinary: Deferred Musculoskeletal: Normal range of motion in all extremities. No lower extremity edema. Small abrasion over right 2nd MCP. No tenderness to palpation over the joint or deformity. No hip tenderness to palpation  or manipulation bilaterally. Neurologic:  Normal speech and language. No gross focal neurologic deficits are appreciated.  Skin:  Skin is warm, dry and intact. No rash noted. Psychiatric: Mood and affect are normal. Speech and behavior are normal. Patient exhibits appropriate insight and judgment.  ____________________________________________    LABS (pertinent positives/negatives)  Labs Reviewed  CBC WITH DIFFERENTIAL/PLATELET - Abnormal; Notable for the following:       Result Value   RDW 15.2 (*)    All other components within normal limits  BASIC METABOLIC PANEL - Abnormal; Notable for the following:    Potassium 3.1 (*)    Chloride 99 (*)    Glucose, Bld 158 (*)    BUN 23 (*)    Creatinine, Ser 2.10 (*)    GFR calc non Af Amer 19 (*)    GFR calc Af Amer 22 (*)    All other components within normal limits  TROPONIN I  URINALYSIS COMPLETEWITH MICROSCOPIC ([facility] ONLY)     ____________________________________________  EKG  None  ____________________________________________    RADIOLOGY  CT head/c spine  IMPRESSION:  1. No acute intracranial abnormality.  2. Advanced chronic microvascular ischemic changes and moderate  brain parenchymal volume loss.  3. New left frontal scalp contusion with laceration. No displaced  calvarial fracture.  4. No acute fracture or dislocation of the cervical spine.  5. Moderate cervical spondylosis without high-grade bony canal  stenosis.  6. Extensive calcific atherosclerosis of bilateral carotid  bifurcations.    ____________________________________________   PROCEDURES  Procedures  LACERATION REPAIR Performed by: Phineas SemenGOODMAN, Monterrio Gerst Authorized by: Phineas SemenGOODMAN, Tim Corriher Consent: Verbal consent obtained. Risks and benefits: risks, benefits and alternatives were discussed Consent given by: patient Patient identity confirmed: provided demographic data Prepped and Draped in normal sterile fashion Wound explored  Laceration  Location: left forehead  Laceration Length: 3cm  No Foreign Bodies seen or palpated  Irrigation method: syringe Amount of cleaning: standard  Skin closure: 5-0 vicryl rapide  Number of sutures: 9  Technique: simple interrupted  Patient tolerance: Patient tolerated the procedure well with no immediate complications.  ____________________________________________   INITIAL IMPRESSION / ASSESSMENT AND PLAN / ED COURSE  Pertinent labs & imaging results that were available during my care of the patient were reviewed by me and considered in my medical decision making (see chart for details).  Patient presents from living facility after an unwitnessed fall. She did suffer laceration to her forehead. CT head and cervical spine were negative for acute pathology. The laceration was sutured closed. Patient tolerated the procedure well. Blood work without any concerning findings.  ____________________________________________   FINAL CLINICAL IMPRESSION(S) / ED DIAGNOSES  Final diagnoses:  Injury of head, initial encounter  Laceration of scalp without foreign body, initial encounter     Note: This dictation was prepared with Dragon dictation. Any transcriptional errors that result from this process are unintentional    Phineas SemenGraydon Arun Herrod, MD 01/11/16 2312

## 2016-01-11 NOTE — ED Notes (Signed)
Patient transported to CT 

## 2016-01-11 NOTE — ED Notes (Signed)
MD made aware of vomiting pt given zofran Pt cleaned need gauze applied. BP elevated and MD aware. Will continue to monitor.

## 2016-01-11 NOTE — Discharge Instructions (Addendum)
The sutures that were placed to day will absorb on their own in about 7-10 days. They do not need to be removed. Take antibiotic as prescribed. Please seek medical attention for any high fevers, chest pain, shortness of breath, change in behavior, persistent vomiting, bloody stool or any other new or concerning symptoms.

## 2016-01-11 NOTE — ED Notes (Signed)
Pt back from CT. CT tech reported vomiting 3 times pt toook gauze off head bleeding and right index finger bleeding.

## 2016-01-11 NOTE — ED Triage Notes (Addendum)
Pt presents via ems from [facility] s/p unwitness fall. Pt hx of multple falls. Old bruise to back of head from last week. Tonight injury from fall laceration ~ 3in long and 0.5in wide. Bleeding under control with gauze. Pt has small scratch to right index finger and brusing. EMS reports left hip pain on arrvial no noted here. Pt only c/o of head pain.  No distress noted. Pt is alert to self only. Hx of dementia. Pt not on blood thinners.

## 2016-01-11 NOTE — ED Notes (Signed)
MD at the bedside doing sutures. Faclilty med tec called to check on pt. [employer] Med tech update and I asked her to send copy of her MAR over.

## 2016-01-11 NOTE — ED Notes (Signed)
Pt head gauze saturated and ozzing. New gauze applied. Pt Is resting no more vomiting or nausea.

## 2016-01-12 DIAGNOSIS — S0181XA Laceration without foreign body of other part of head, initial encounter: Secondary | ICD-10-CM | POA: Diagnosis not present

## 2016-01-12 LAB — URINALYSIS COMPLETE WITH MICROSCOPIC (ARMC ONLY)
BACTERIA UA: NONE SEEN
Bilirubin Urine: NEGATIVE
Glucose, UA: NEGATIVE mg/dL
HGB URINE DIPSTICK: NEGATIVE
Ketones, ur: NEGATIVE mg/dL
Nitrite: NEGATIVE
PH: 8 (ref 5.0–8.0)
Protein, ur: 100 mg/dL — AB
Specific Gravity, Urine: 1.008 (ref 1.005–1.030)

## 2016-01-12 MED ORDER — CEPHALEXIN 500 MG PO CAPS
500.0000 mg | ORAL_CAPSULE | Freq: Once | ORAL | Status: AC
  Administered 2016-01-12: 500 mg via ORAL
  Filled 2016-01-12: qty 1

## 2016-01-12 MED ORDER — CEPHALEXIN 500 MG PO CAPS: 500.0000 mg | ORAL_CAPSULE | Freq: Three times a day (TID) | ORAL | 0 refills | Status: DC

## 2016-01-12 NOTE — ED Notes (Signed)
Report given to [facility] med tech in regards to laceration and sutures. Also that she has UTI and will be on ABT.

## 2016-01-12 NOTE — ED Notes (Signed)
Discharge instructions reviewed with patient and [facility] tech via phone. Questions fielded by this RN. Patient and [facility] med tech verbalizes understanding of instructions. Patient discharged back to [facility] in stable condition per Dolores FrameSung MD . No acute distress noted at time of discharge.

## 2016-01-12 NOTE — ED Notes (Signed)
Pt attempted to get urine multiples unsuccessful. This time pt was incontinent in brief. MD aware requesting to in and out cath.

## 2016-01-12 NOTE — ED Provider Notes (Signed)
-----------------------------------------   1:18 AM on 01/12/2016 -----------------------------------------  Urinalysis remarkable for UTI. Will place on Keflex. Patient is overall feeling better and will be discharged back to nursing facility. Strict return precautions given. Patient verbalizes understanding and agrees with plan of care.   Denise HongJade J Shyleigh Daughtry, MD 01/12/16 [phone]

## 2016-02-22 ENCOUNTER — Emergency Department
Admission: EM | Admit: 2016-02-22 | Discharge: 2016-02-22 | Disposition: A | Discharge status: Home / Self Care | Payer: Medicare Other | Attending: Emergency Medicine | Admitting: Emergency Medicine

## 2016-02-22 ENCOUNTER — Emergency Department: Payer: Medicare Other

## 2016-02-22 DIAGNOSIS — E119 Type 2 diabetes mellitus without complications: Secondary | ICD-10-CM | POA: Diagnosis not present

## 2016-02-22 DIAGNOSIS — F0390 Unspecified dementia without behavioral disturbance: Secondary | ICD-10-CM | POA: Insufficient documentation

## 2016-02-22 DIAGNOSIS — Z791 Long term (current) use of non-steroidal anti-inflammatories (NSAID): Secondary | ICD-10-CM | POA: Insufficient documentation

## 2016-02-22 DIAGNOSIS — Y999 Unspecified external cause status: Secondary | ICD-10-CM | POA: Insufficient documentation

## 2016-02-22 DIAGNOSIS — S0990XA Unspecified injury of head, initial encounter: Secondary | ICD-10-CM | POA: Insufficient documentation

## 2016-02-22 DIAGNOSIS — I10 Essential (primary) hypertension: Secondary | ICD-10-CM | POA: Diagnosis not present

## 2016-02-22 DIAGNOSIS — Y939 Activity, unspecified: Secondary | ICD-10-CM | POA: Diagnosis not present

## 2016-02-22 DIAGNOSIS — W1839XA Other fall on same level, initial encounter: Secondary | ICD-10-CM | POA: Diagnosis not present

## 2016-02-22 DIAGNOSIS — Y92129 Unspecified place in nursing home as the place of occurrence of the external cause: Secondary | ICD-10-CM | POA: Diagnosis not present

## 2016-02-22 DIAGNOSIS — R197 Diarrhea, unspecified: Secondary | ICD-10-CM | POA: Diagnosis not present

## 2016-02-22 DIAGNOSIS — Z79899 Other long term (current) drug therapy: Secondary | ICD-10-CM | POA: Insufficient documentation

## 2016-02-22 DIAGNOSIS — W19XXXA Unspecified fall, initial encounter: Secondary | ICD-10-CM

## 2016-02-22 LAB — URINALYSIS, COMPLETE (UACMP) WITH MICROSCOPIC
BACTERIA UA: NONE SEEN
BILIRUBIN URINE: NEGATIVE
Glucose, UA: NEGATIVE mg/dL
KETONES UR: NEGATIVE mg/dL
Leukocytes, UA: NEGATIVE
NITRITE: NEGATIVE
Protein, ur: 100 mg/dL — AB
SPECIFIC GRAVITY, URINE: 1.008 (ref 1.005–1.030)
Squamous Epithelial / LPF: NONE SEEN
pH: 6 (ref 5.0–8.0)

## 2016-02-22 LAB — COMPREHENSIVE METABOLIC PANEL
ALT: 8 U/L — AB (ref 14–54)
AST: 19 U/L (ref 15–41)
Albumin: 3.7 g/dL (ref 3.5–5.0)
Alkaline Phosphatase: 60 U/L (ref 38–126)
Anion gap: 8 (ref 5–15)
BUN: 32 mg/dL — AB (ref 6–20)
CHLORIDE: 100 mmol/L — AB (ref 101–111)
CO2: 28 mmol/L (ref 22–32)
Calcium: 9.3 mg/dL (ref 8.9–10.3)
Creatinine, Ser: 2.36 mg/dL — ABNORMAL HIGH (ref 0.44–1.00)
GFR calc Af Amer: 19 mL/min — ABNORMAL LOW (ref >60)
GFR calc non Af Amer: 17 mL/min — ABNORMAL LOW (ref >60)
Glucose, Bld: 87 mg/dL (ref 65–99)
POTASSIUM: 3.6 mmol/L (ref 3.5–5.1)
Sodium: 136 mmol/L (ref 135–145)
Total Bilirubin: 0.8 mg/dL (ref 0.3–1.2)
Total Protein: 6.9 g/dL (ref 6.5–8.1)

## 2016-02-22 LAB — CBC WITH DIFFERENTIAL/PLATELET
Basophils Absolute: 0 10*3/uL (ref 0–0.1)
Basophils Relative: 1 %
EOS PCT: 0 %
Eosinophils Absolute: 0 10*3/uL (ref 0–0.7)
HCT: 34.4 % — ABNORMAL LOW (ref 35.0–47.0)
Hemoglobin: 11.5 g/dL — ABNORMAL LOW (ref 12.0–16.0)
LYMPHS ABS: 1.2 10*3/uL (ref 1.0–3.6)
LYMPHS PCT: 24 %
MCH: 27.2 pg (ref 26.0–34.0)
MCHC: 33.3 g/dL (ref 32.0–36.0)
MCV: 81.7 fL (ref 80.0–100.0)
MONO ABS: 0.4 10*3/uL (ref 0.2–0.9)
MONOS PCT: 8 %
Neutro Abs: 3.4 10*3/uL (ref 1.4–6.5)
Neutrophils Relative %: 67 %
PLATELETS: 162 10*3/uL (ref 150–440)
RBC: 4.22 MIL/uL (ref 3.80–5.20)
RDW: 15.1 % — AB (ref 11.5–14.5)
WBC: 5 10*3/uL (ref 3.6–11.0)

## 2016-02-22 LAB — TROPONIN I
TROPONIN I: 0.03 ng/mL — AB (ref <0.03)
Troponin I: 0.04 ng/mL (ref <0.03)

## 2016-02-22 LAB — LIPASE, BLOOD: Lipase: 73 U/L — ABNORMAL HIGH (ref 11–51)

## 2016-02-22 MED ORDER — SODIUM CHLORIDE 0.9 % IV BOLUS (SEPSIS)
500.0000 mL | Freq: Once | INTRAVENOUS | Status: AC
  Administered 2016-02-22: 500 mL via INTRAVENOUS

## 2016-02-22 NOTE — ED Provider Notes (Addendum)
[facility] Emergency Department Provider Note  ____________________________________________   I have reviewed the triage vital signs and the nursing notes.   HISTORY  Chief Complaint Fall    HPI [name] is a 83 y.o. female who presents today stating that she fell at nursing home. Patient had a mechanical fall. She members falling. This is also verified by EMS. There was apparently a blow to the back of her head. She did not pass out. He is also documented that multiple people including her have had diarrhea at the nursing home. She does not complain of any abdominal pain or other acute pathology at this time.History is somewhat limited by dementia, however patient is at her baseline crit to EMS.       Past Medical History:  Diagnosis Date  . Anemia   . Arthritis   . Dementia   . Diabetes mellitus without complication (HCC)   . Essential (primary) hypertension   . Hyperlipidemia   . Old myocardial infarction   . Rhabdomyolysis     Patient Active Problem List   Diagnosis Date Noted  . ARF (acute renal failure) (HCC) 07/11/2015  . UTI (lower urinary tract infection) 06/23/2015  . Cholecystitis 05/29/2015  . Pressure ulcer 05/29/2015  . Old myocardial infarction 08/29/2013  . Essential hypertension 08/29/2013  . Hyperlipidemia 08/29/2013    Past Surgical History:  Procedure Laterality Date  . 2000- hip replacement    . APPENDECTOMY    . broke femur  2015   right  . CESAREAN SECTION    . Left hip surgery    . right arm surgery- bone grafts and 3 plates Left 1191    Prior to Admission medications   Medication Sig Start Date End Date Taking? Authorizing Provider  acetaminophen (TYLENOL) 500 MG tablet Take 500 mg by mouth every 4 (four) hours as needed for mild pain, fever or headache.    Historical Provider, MD  alum & mag hydroxide-simeth (MAALOX/MYLANTA) 200-200-20 MG/5ML suspension Take 30 mLs by mouth every 6 (six) hours as needed  for indigestion or heartburn.    Historical Provider, MD  amLODipine (NORVASC) 10 MG tablet Take 1 tablet (10 mg total) by mouth daily. 10/01/15 09/30/16  Nita Sickle, MD  ARIPiprazole (ABILIFY) 2 MG tablet Take 2 mg by mouth daily.    Historical Provider, MD  calcium carbonate (OS-CAL) 600 MG TABS tablet Take 600 mg by mouth 2 (two) times daily with a meal.    Historical Provider, MD  cephALEXin (KEFLEX) 500 MG capsule Take 1 capsule (500 mg total) by mouth 3 (three) times daily. 01/12/16   Irean Hong, MD  Cholecalciferol (VITAMIN D) 2000 UNITS CAPS Take 2,000 Units by mouth daily.    Historical Provider, MD  docusate sodium (COLACE) 100 MG capsule Take 1 capsule (100 mg total) by mouth 2 (two) times daily. Hold for > 2 bowel movements in a day 05/31/15   Enid Baas, MD  donepezil (ARICEPT) 10 MG tablet Take 10 mg by mouth at bedtime.    Historical Provider, MD  guaifenesin (ROBITUSSIN) 100 MG/5ML syrup Take 200 mg by mouth 4 (four) times daily as needed for cough.    Historical Provider, MD  isosorbide mononitrate (IMDUR) 30 MG 24 hr tablet Take 1 tablet (30 mg total) by mouth daily. 05/31/15   Enid Baas, MD  loperamide (IMODIUM) 2 MG capsule Take 2 mg by mouth as needed for diarrhea or loose stools.    Historical Provider, MD  magnesium hydroxide (MILK OF MAGNESIA) 400 MG/5ML suspension Take 30 mLs by mouth at bedtime as needed for mild constipation.    Historical Provider, MD  metoprolol (LOPRESSOR) 50 MG tablet Take 1 tablet (50 mg total) by mouth 2 (two) times daily. 05/31/15   Enid Baas, MD  neomycin-bacitracin-polymyxin (NEOSPORIN) [phone] ointment Apply 1 application topically as needed (wound care).    Historical Provider, MD  ondansetron (ZOFRAN) 4 MG tablet Take 4 mg by mouth every 8 (eight) hours as needed for nausea or vomiting.    Historical Provider, MD  ranitidine (ZANTAC) 150 MG tablet Take 150 mg by mouth at bedtime.    Historical Provider, MD  sertraline  (ZOLOFT) 50 MG tablet Take 50 mg by mouth daily.    Historical Provider, MD  shark liver oil-cocoa butter (PREPARATION H) 0.25-3-85.5 % suppository Place 1 suppository rectally every 4 (four) hours as needed for hemorrhoids.     Historical Provider, MD  traZODone (DESYREL) 50 MG tablet Take 25 mg by mouth at bedtime.    Historical Provider, MD    Allergies Codeine and Sulfa antibiotics  Family History  Problem Relation Age of Onset  . CAD Father   . Prostate cancer Brother     Social History Social History  Substance Use Topics  . Smoking status: Never Smoker  . Smokeless tobacco: Never Used  . Alcohol use No    Review of Systems Constitutional: No fever/chills Eyes: No visual changes. ENT: No sore throat. No stiff neck no neck pain Cardiovascular: Denies chest pain. Respiratory: Denies shortness of breath. Gastrointestinal:   no vomiting.  Positive nonbloody diarrhea.  No constipation. Genitourinary: Negative for dysuria. Musculoskeletal: Negative lower extremity swelling Skin: Negative for rash. Neurological: Negative for severe headaches, focal weakness or numbness. 10-point ROS otherwise negative.  ____________________________________________   PHYSICAL EXAM:  VITAL SIGNS: ED Triage Vitals  Enc Vitals Group     BP 02/22/16 1324 138/60     Pulse Rate 02/22/16 1324 (!) 59     Resp 02/22/16 1324 16     Temp 02/22/16 1324 97.6 F (36.4 C)     Temp Source 02/22/16 1324 Oral     SpO2 02/22/16 1324 100 %     Weight 02/22/16 1319 122 lb 8 oz (55.6 kg)     Height --      Head Circumference --      Peak Flow --      Pain Score --      Pain Loc --      Pain Edu? --      Excl. in GC? --     Constitutional: Alert and oriented. Well appearing and in no acute distress. Eyes: Conjunctivae are normal. PERRL. EOMI. Head: Atraumatic. Nose: No congestion/rhinnorhea. Mouth/Throat: Mucous membranes are Somewhat dry.  Oropharynx non-erythematous. Neck: No stridor.  No   Midline tenderness but there is some tenderness palpation in the paraspinal muscles. Cardiovascular: Normal rate, regular rhythm. Grossly normal heart sounds.  Good peripheral circulation. Respiratory: Normal respiratory effort.  No retractions. Lungs CTAB. Abdominal: Soft and nontender. No distention. No guarding no rebound Back:  There is no focal tenderness or step off.  there is no midline tenderness there are no lesions noted. there is no CVA tenderness Musculoskeletal: No lower extremity tenderness, no upper extremity tenderness. No joint effusions, no DVT signs strong distal pulses no edema Neurologic:  Normal speech and language. No gross focal neurologic deficits are appreciated.  Skin:  Skin is warm, dry and  intact. No rash noted. Psychiatric: Mood and affect are normal. Speech and behavior are normal.  ____________________________________________   LABS (all labs ordered are listed, but only abnormal results are displayed)  Labs Reviewed  CBC WITH DIFFERENTIAL/PLATELET - Abnormal; Notable for the following:       Result Value   Hemoglobin 11.5 (*)    HCT 34.4 (*)    RDW 15.1 (*)    All other components within normal limits  COMPREHENSIVE METABOLIC PANEL  TROPONIN I  LIPASE, BLOOD  URINALYSIS, COMPLETE (UACMP) WITH MICROSCOPIC   ____________________________________________  EKG  I personally interpreted any EKGs ordered by me or triage Sinus bradycardia rate 58 bpm nonspecific ST flattening or acute ST elevation or depression and ____________________________________________  RADIOLOGY  I reviewed any imaging ordered by me or triage that were performed during my shift and, if possible, patient and/or family made aware of any abnormal findings. ____________________________________________   PROCEDURES  Procedure(s) performed: None  Procedures  Critical Care performed: None  ____________________________________________   INITIAL IMPRESSION / ASSESSMENT AND  PLAN / ED COURSE  Pertinent labs & imaging results that were available during my care of the patient were reviewed by me and considered in my medical decision making (see chart for details).  Patient here after a mechanical fall CT scan of her head is negative CT scan of her cervical spine is negative. Patient has had diarrhea, so we're giving her IV fluid checking basic blood work and we will reassess. She denies any ongoing symptoms. Large community burden of viral gastroenteritis.  ----------------------------------------- 3:00 PM on 02/22/2016 -----------------------------------------  Blood work shows some mild contraction, she is up somewhat from her baseline creatinine we'll give her fluids. I do think that on her own mandates admission. Troponin is borderline we will recheck it. White count is reassuring she is not anemic and she has no ongoing complaints, no evidence of hip fracture. We will signed out to Loreli Dollaravid Shaevitz MD at the end of my shift.  Clinical Course    ____________________________________________   FINAL CLINICAL IMPRESSION(S) / ED DIAGNOSES  Final diagnoses:  None      This chart was dictated using voice recognition software.  Despite best efforts to proofread,  errors can occur which can change meaning.      Jeanmarie PlantJames A Cheryal Salas, MD 02/22/16 1500    Jeanmarie PlantJames A Inayah Woodin, MD 02/22/16 1520

## 2016-02-22 NOTE — ED Provider Notes (Signed)
Signout from Dr. Alphonzo LemmingsMcShane in this 83 year old female who had a fall this afternoon. Originally with mildly elevated troponin. No other abnormalities found that would require admission or further treatment. Plan is to follow up with the second troponin.  ----------------------------------------- 5:03 PM on 02/22/2016 -----------------------------------------   Physical Exam  BP 135/60   Pulse 61   Temp 97.6 F (36.4 C) (Oral)   Resp 16   Wt 122 lb 8 oz (55.6 kg)   SpO2 97%   BMI 23.92 kg/m   Physical Exam Patient resting comfortably at this time. No complaints. ED Course  Procedures  MDM Second troponin is 0.03. Plan on discharging the patient. She has no plans at this time. I discussed with her the plan as well as lab results and she is understanding and willing to comply.       Myrna Blazeravid Matthew Schaevitz, MD 02/22/16 [phone]

## 2016-02-22 NOTE — ED Triage Notes (Signed)
Pt bib EMS from [facility] w/ c/o witnessed mechanical fall.  Per EMS, pt hit back of head and c/o neck pain.  Pt alert, no n/v present/  Pt oriented to self and situation.  Resp even and unlabored.  Per EMS, facility reported that pt was having s/s of noro virus

## 2016-02-22 NOTE — ED Notes (Signed)
Patient transported to CT 

## 2016-02-22 NOTE — ED Notes (Addendum)
Report given to ColonBrandy from [facility].  Gearldine BienenstockBrandy stated that they did not have transport at this time

## 2016-03-02 ENCOUNTER — Observation Stay
Admission: EM | Admit: 2016-03-02 | Discharge: 2016-03-04 | Disposition: A | Discharge status: Skilled Nursing Facility | Payer: Medicare Other | Attending: Internal Medicine | Admitting: Internal Medicine

## 2016-03-02 ENCOUNTER — Emergency Department: Payer: Medicare Other

## 2016-03-02 ENCOUNTER — Encounter: Payer: Self-pay | Admitting: *Deleted

## 2016-03-02 DIAGNOSIS — J101 Influenza due to other identified influenza virus with other respiratory manifestations: Secondary | ICD-10-CM | POA: Diagnosis not present

## 2016-03-02 DIAGNOSIS — H02106 Unspecified ectropion of left eye, unspecified eyelid: Secondary | ICD-10-CM | POA: Diagnosis not present

## 2016-03-02 DIAGNOSIS — Z79899 Other long term (current) drug therapy: Secondary | ICD-10-CM | POA: Diagnosis not present

## 2016-03-02 DIAGNOSIS — R531 Weakness: Secondary | ICD-10-CM | POA: Insufficient documentation

## 2016-03-02 DIAGNOSIS — Z96642 Presence of left artificial hip joint: Secondary | ICD-10-CM | POA: Insufficient documentation

## 2016-03-02 DIAGNOSIS — I639 Cerebral infarction, unspecified: Secondary | ICD-10-CM

## 2016-03-02 DIAGNOSIS — I2510 Atherosclerotic heart disease of native coronary artery without angina pectoris: Secondary | ICD-10-CM | POA: Insufficient documentation

## 2016-03-02 DIAGNOSIS — I6782 Cerebral ischemia: Secondary | ICD-10-CM | POA: Diagnosis not present

## 2016-03-02 DIAGNOSIS — N184 Chronic kidney disease, stage 4 (severe): Secondary | ICD-10-CM | POA: Insufficient documentation

## 2016-03-02 DIAGNOSIS — F0390 Unspecified dementia without behavioral disturbance: Secondary | ICD-10-CM | POA: Diagnosis not present

## 2016-03-02 DIAGNOSIS — Y92129 Unspecified place in nursing home as the place of occurrence of the external cause: Secondary | ICD-10-CM | POA: Diagnosis not present

## 2016-03-02 DIAGNOSIS — R778 Other specified abnormalities of plasma proteins: Secondary | ICD-10-CM | POA: Diagnosis present

## 2016-03-02 DIAGNOSIS — R7989 Other specified abnormal findings of blood chemistry: Secondary | ICD-10-CM | POA: Diagnosis present

## 2016-03-02 DIAGNOSIS — I129 Hypertensive chronic kidney disease with stage 1 through stage 4 chronic kidney disease, or unspecified chronic kidney disease: Secondary | ICD-10-CM | POA: Diagnosis not present

## 2016-03-02 DIAGNOSIS — W19XXXA Unspecified fall, initial encounter: Secondary | ICD-10-CM | POA: Diagnosis not present

## 2016-03-02 DIAGNOSIS — R748 Abnormal levels of other serum enzymes: Secondary | ICD-10-CM | POA: Insufficient documentation

## 2016-03-02 DIAGNOSIS — R251 Tremor, unspecified: Secondary | ICD-10-CM | POA: Diagnosis present

## 2016-03-02 DIAGNOSIS — I252 Old myocardial infarction: Secondary | ICD-10-CM | POA: Insufficient documentation

## 2016-03-02 DIAGNOSIS — Z66 Do not resuscitate: Secondary | ICD-10-CM | POA: Diagnosis not present

## 2016-03-02 DIAGNOSIS — E1122 Type 2 diabetes mellitus with diabetic chronic kidney disease: Secondary | ICD-10-CM | POA: Insufficient documentation

## 2016-03-02 LAB — BASIC METABOLIC PANEL
Anion gap: 7 (ref 5–15)
BUN: 29 mg/dL — AB (ref 6–20)
CALCIUM: 8.4 mg/dL — AB (ref 8.9–10.3)
CO2: 24 mmol/L (ref 22–32)
Chloride: 107 mmol/L (ref 101–111)
Creatinine, Ser: 2.09 mg/dL — ABNORMAL HIGH (ref 0.44–1.00)
GFR calc Af Amer: 22 mL/min — ABNORMAL LOW (ref >60)
GFR, EST NON AFRICAN AMERICAN: 19 mL/min — AB (ref >60)
Glucose, Bld: 81 mg/dL (ref 65–99)
POTASSIUM: 3.3 mmol/L — AB (ref 3.5–5.1)
SODIUM: 138 mmol/L (ref 135–145)

## 2016-03-02 LAB — TROPONIN I: TROPONIN I: 0.03 ng/mL — AB (ref <0.03)

## 2016-03-02 LAB — CBC
HEMATOCRIT: 33.8 % — AB (ref 35.0–47.0)
Hemoglobin: 11.2 g/dL — ABNORMAL LOW (ref 12.0–16.0)
MCH: 26.6 pg (ref 26.0–34.0)
MCHC: 33.1 g/dL (ref 32.0–36.0)
MCV: 80.4 fL (ref 80.0–100.0)
PLATELETS: 233 10*3/uL (ref 150–440)
RBC: 4.2 MIL/uL (ref 3.80–5.20)
RDW: 14.8 % — AB (ref 11.5–14.5)
WBC: 7.8 10*3/uL (ref 3.6–11.0)

## 2016-03-02 NOTE — ED Notes (Signed)
fsbs  122 on the scene per ems.

## 2016-03-02 NOTE — ED Triage Notes (Signed)
Pt brought in via ems from [facility] with tremors and leaning to the left since 2045 tonight.  Pt has dementia.  On arrival to er , pt alert and denies any pain at this time.  Pt was seen in er recently for a fall.

## 2016-03-02 NOTE — ED Notes (Signed)
md in with pt.  Pt awake and alert.  Speech clear.

## 2016-03-02 NOTE — ED Notes (Signed)
Ems report patient has new onset of tremors and is leaning to the left.  Pt denies headache   No fall.  Pt has dementia  Pt is answering questions appro.  Speech clear.  nsr on monitor.  Iv started and labs sent.

## 2016-03-02 NOTE — ED Provider Notes (Signed)
[facility] Emergency Department Provider Note _   First MD Initiated Contact with Patient 03/02/16 2312     (approximate)  I have reviewed the triage vital signs and the nursing notes.   HISTORY  Chief Complaint Tremors and Weakness   HPI [name] is a 83 y.o. female with Melissa chronic medical conditions presents to the emergency department acute onset of tremors at 8:45 PM today per nursing home staff. In addition staff states that patient's been leaning to the left side. Patient does not recall having any tremors and has no complaints at this time. She denies any pain. Patient denies any fall today.   Past Medical History:  Diagnosis Date  . Anemia   . Arthritis   . Dementia   . Diabetes mellitus without complication (HCC)   . Essential (primary) hypertension   . Hyperlipidemia   . Old myocardial infarction   . Rhabdomyolysis     Patient Active Problem List   Diagnosis Date Noted  . Elevated troponin 03/03/2016  . ARF (acute renal failure) (HCC) 07/11/2015  . UTI (lower urinary tract infection) 06/23/2015  . Cholecystitis 05/29/2015  . Pressure ulcer 05/29/2015  . Old myocardial infarction 08/29/2013  . Essential hypertension 08/29/2013  . Hyperlipidemia 08/29/2013    Past Surgical History:  Procedure Laterality Date  . 2000- hip replacement    . APPENDECTOMY    . broke femur  2015   right  . CESAREAN SECTION    . Left hip surgery    . right arm surgery- bone grafts and 3 plates Left 2130    Prior to Admission medications   Medication Sig Start Date End Date Taking? Authorizing Provider  acetaminophen (TYLENOL) 500 MG tablet Take 500 mg by mouth every 4 (four) hours as needed for mild pain, fever or headache.   Yes Historical Provider, MD  alum & mag hydroxide-simeth (MAALOX/MYLANTA) 200-200-20 MG/5ML suspension Take 30 mLs by mouth every 6 (six) hours as needed for indigestion or heartburn.   Yes Historical Provider, MD    amLODipine (NORVASC) 10 MG tablet Take 1 tablet (10 mg total) by mouth daily. 10/01/15 09/30/16 Yes Nita Sickle, MD  ARIPiprazole (ABILIFY) 2 MG tablet Take 2 mg by mouth daily.   Yes Historical Provider, MD  calcium carbonate (OS-CAL) 600 MG TABS tablet Take 600 mg by mouth 2 (two) times daily with a meal.   Yes Historical Provider, MD  Cholecalciferol (VITAMIN D) 2000 UNITS CAPS Take 2,000 Units by mouth daily.   Yes Historical Provider, MD  docusate sodium (COLACE) 100 MG capsule Take 1 capsule (100 mg total) by mouth 2 (two) times daily. Hold for > 2 bowel movements in a day 05/31/15  Yes Enid Baas, MD  donepezil (ARICEPT) 10 MG tablet Take 10 mg by mouth at bedtime.   Yes Historical Provider, MD  erythromycin ophthalmic ointment Place 1 application into the left eye daily. Apply 2-4 times every day.   Yes Historical Provider, MD  guaifenesin (ROBITUSSIN) 100 MG/5ML syrup Take 200 mg by mouth 4 (four) times daily as needed for cough.   Yes Historical Provider, MD  isosorbide mononitrate (IMDUR) 30 MG 24 hr tablet Take 1 tablet (30 mg total) by mouth daily. 05/31/15  Yes Enid Baas, MD  loperamide (IMODIUM) 2 MG capsule Take 2 mg by mouth as needed for diarrhea or loose stools.   Yes Historical Provider, MD  LORazepam (ATIVAN) 0.5 MG tablet Take 0.5 mg by mouth 2 (two) times  daily as needed (agitation).   Yes Historical Provider, MD  magnesium hydroxide (MILK OF MAGNESIA) 400 MG/5ML suspension Take 30 mLs by mouth at bedtime as needed for mild constipation.   Yes Historical Provider, MD  metoprolol (LOPRESSOR) 50 MG tablet Take 1 tablet (50 mg total) by mouth 2 (two) times daily. 05/31/15  Yes Enid Baas, MD  neomycin-bacitracin-polymyxin (NEOSPORIN) [phone] ointment Apply 1 application topically as needed (wound care).   Yes Historical Provider, MD  ondansetron (ZOFRAN) 4 MG tablet Take 4 mg by mouth every 8 (eight) hours as needed for nausea or vomiting.   Yes  Historical Provider, MD  ranitidine (ZANTAC) 150 MG tablet Take 150 mg by mouth at bedtime.   Yes Historical Provider, MD  sertraline (ZOLOFT) 50 MG tablet Take 50 mg by mouth daily.   Yes Historical Provider, MD  shark liver oil-cocoa butter (PREPARATION H) 0.25-3-85.5 % suppository Place 1 suppository rectally every 4 (four) hours as needed for hemorrhoids.    Yes Historical Provider, MD  traMADol (ULTRAM) 50 MG tablet Take 25 mg by mouth every 8 (eight) hours as needed for moderate pain or severe pain.   Yes Historical Provider, MD  traZODone (DESYREL) 50 MG tablet Take 25 mg by mouth at bedtime.   Yes Historical Provider, MD    Allergies Codeine and Sulfa antibiotics  Family History  Problem Relation Age of Onset  . CAD Father   . Prostate cancer Brother     Social History Social History  Substance Use Topics  . Smoking status: Never Smoker  . Smokeless tobacco: Never Used  . Alcohol use No    Review of Systems Constitutional: No fever/chills Eyes: No visual changes. ENT: No sore throat. Cardiovascular: Denies chest pain. Respiratory: Denies shortness of breath. Gastrointestinal: No abdominal pain.  No nausea, no vomiting.  No diarrhea.  No constipation. Genitourinary: Negative for dysuria. Musculoskeletal: Negative for back pain. Skin: Negative for rash. Neurological: Negative for headaches, focal weakness or numbness.  10-point ROS otherwise negative.  ____________________________________________   PHYSICAL EXAM:  VITAL SIGNS: ED Triage Vitals  Enc Vitals Group     BP 03/02/16 2150 (!) 153/69     Pulse Rate 03/02/16 2150 63     Resp 03/02/16 2150 18     Temp 03/02/16 2150 97.5 F (36.4 C)     Temp Source 03/02/16 2150 Oral     SpO2 03/02/16 2150 100 %     Weight 03/02/16 2151 130 lb (59 kg)     Height 03/02/16 2151 5\' 5"  (1.651 m)     Head Circumference --      Peak Flow --      Pain Score --      Pain Loc --      Pain Edu? --      Excl. in GC? --      Constitutional: Alert and oriented. Well appearing and in no acute distress. Eyes: Conjunctivae are normal. PERRL. EOMI. Head: Atraumatic. Ears:  Healthy appearing ear canals and TMs bilaterally Nose: No congestion/rhinnorhea. Mouth/Throat: Mucous membranes are moist.  Oropharynx non-erythematous. Neck: No stridor.  No cervical spine tenderness to palpation. Cardiovascular: Normal rate, regular rhythm. Good peripheral circulation. Grossly normal heart sounds. Respiratory: Normal respiratory effort.  No retractions. Lungs CTAB. Gastrointestinal: Soft and nontender. No distention.  Musculoskeletal: No lower extremity tenderness nor edema. No gross deformities of extremities. Neurologic:  Normal speech and language. No gross focal neurologic deficits are appreciated.  Skin:  Skin is warm, dry  and intact. No rash noted.   ____________________________________________   LABS (all labs ordered are listed, but only abnormal results are displayed)  Labs Reviewed  BASIC METABOLIC PANEL - Abnormal; Notable for the following:       Result Value   Potassium 3.3 (*)    BUN 29 (*)    Creatinine, Ser 2.09 (*)    Calcium 8.4 (*)    GFR calc non Af Amer 19 (*)    GFR calc Af Amer 22 (*)    All other components within normal limits  CBC - Abnormal; Notable for the following:    Hemoglobin 11.2 (*)    HCT 33.8 (*)    RDW 14.8 (*)    All other components within normal limits  TROPONIN I - Abnormal; Notable for the following:    Troponin I 0.03 (*)    All other components within normal limits  CBG MONITORING, ED   ____________________________________________  EKG  ED ECG REPORT I, [place] N BROWN, the attending physician, personally viewed and interpreted this ECG.   Date: 03/02/2016  EKG Time: 9:51 PM  Rate: 62  Rhythm: Normal sinus rhythm  Axis: Normal  Intervals: Normal  ST&T Change: None  ____________________________________________  RADIOLOGY I, [place] N BROWN,  personally viewed and evaluated these images (plain radiographs) as part of my medical decision making, as well as reviewing the written report by the radiologist.  Ct Head Wo Contrast  Result Date: 03/03/2016 CLINICAL DATA:  Initial evaluation for new onset tremors with containing to left. EXAM: CT HEAD WITHOUT CONTRAST TECHNIQUE: Contiguous axial images were obtained from the base of the skull through the vertex without intravenous contrast. COMPARISON:  Prior CT from 02/22/2016. FINDINGS: Brain: Moderate cerebral and cerebellar atrophy with chronic microvascular ischemic disease noted. Scatter remote lacunar infarcts present within the basal ganglia and right thalamus. Remote left cerebellar infarcts. Changes are stable from previous. No acute intracranial hemorrhage. No evidence for acute large vessel territory infarct. No mass lesion, midline shift or mass effect. Ventricular prominence related to global parenchymal volume loss without hydrocephalus. No extra-axial fluid collection. Vascular: No hyperdense vessel. Prominent vascular calcifications noted within the carotid siphons and distal vertebral arteries. Skull: Scalp soft tissues within normal limits.  Calvarium intact. Sinuses/Orbits: Globes and orbital soft tissues within normal limits. Moderate mucosal thickening within the partially visualized right maxillary sinus, with additional opacity within the right frontal sinus and ethmoidal air cells. Paranasal sinuses are otherwise clear. No mastoid effusion. IMPRESSION: 1. No acute intracranial process identified. 2. Remote left cerebellar infarcts, with additional remote lacunar infarcts within the bilateral basal ganglia and right thalamus. 3. Moderate cerebral and cerebellar atrophy with chronic microvascular ischemic disease. Electronically Signed   By: Rise MuBenjamin  McClintock M.D.   On: 03/03/2016 00:01      Procedures    INITIAL IMPRESSION / ASSESSMENT AND PLAN / ED COURSE  Pertinent labs  & imaging results that were available during my care of the patient were reviewed by me and considered in my medical decision making (see chart for details).  Patient discussed with Dr. Sheryle Haildiamond for hospital admission for CVA     ____________________________________________  FINAL CLINICAL IMPRESSION(S) / ED DIAGNOSES  Final diagnoses:  Cerebrovascular accident (CVA), unspecified mechanism (HCC)     MEDICATIONS GIVEN DURING THIS VISIT:  Medications  aspirin chewable tablet 324 mg (324 mg Oral Given 03/03/16 0025)     NEW OUTPATIENT MEDICATIONS STARTED DURING THIS VISIT:  New Prescriptions   No medications on  file    Modified Medications   No medications on file    Discontinued Medications   No medications on file     Note:  This document was prepared using Dragon voice recognition software and may include unintentional dictation errors.    Darci Current, MD 03/03/16 [phone]

## 2016-03-03 ENCOUNTER — Observation Stay
Admit: 2016-03-03 | Discharge: 2016-03-03 | Disposition: A | Discharge status: Home / Self Care | Payer: Medicare Other | Attending: Internal Medicine | Admitting: Internal Medicine

## 2016-03-03 DIAGNOSIS — R778 Other specified abnormalities of plasma proteins: Secondary | ICD-10-CM | POA: Diagnosis present

## 2016-03-03 DIAGNOSIS — J101 Influenza due to other identified influenza virus with other respiratory manifestations: Secondary | ICD-10-CM | POA: Diagnosis not present

## 2016-03-03 DIAGNOSIS — R7989 Other specified abnormal findings of blood chemistry: Secondary | ICD-10-CM

## 2016-03-03 LAB — URINALYSIS, COMPLETE (UACMP) WITH MICROSCOPIC
BILIRUBIN URINE: NEGATIVE
Bacteria, UA: NONE SEEN
Glucose, UA: NEGATIVE mg/dL
Ketones, ur: NEGATIVE mg/dL
LEUKOCYTES UA: NEGATIVE
NITRITE: NEGATIVE
Protein, ur: 30 mg/dL — AB
Specific Gravity, Urine: 1.008 (ref 1.005–1.030)
pH: 7 (ref 5.0–8.0)

## 2016-03-03 LAB — INFLUENZA PANEL BY PCR (TYPE A & B)
INFLAPCR: POSITIVE — AB
INFLBPCR: NEGATIVE

## 2016-03-03 LAB — TSH: TSH: 1.648 u[IU]/mL (ref 0.350–4.500)

## 2016-03-03 LAB — TROPONIN I
Troponin I: 0.03 ng/mL (ref <0.03)
Troponin I: 0.03 ng/mL (ref <0.03)
Troponin I: 0.03 ng/mL (ref <0.03)

## 2016-03-03 LAB — MRSA PCR SCREENING: MRSA by PCR: POSITIVE — AB

## 2016-03-03 MED ORDER — VITAMIN D 1000 UNITS PO TABS
2000.0000 [IU] | ORAL_TABLET | Freq: Every day | ORAL | Status: DC
  Administered 2016-03-03 – 2016-03-04 (×2): 2000 [IU] via ORAL
  Filled 2016-03-03 (×2): qty 2

## 2016-03-03 MED ORDER — METOPROLOL TARTRATE 50 MG PO TABS
50.0000 mg | ORAL_TABLET | Freq: Two times a day (BID) | ORAL | Status: DC
  Administered 2016-03-03 – 2016-03-04 (×3): 50 mg via ORAL
  Filled 2016-03-03 (×3): qty 1

## 2016-03-03 MED ORDER — ISOSORBIDE MONONITRATE ER 60 MG PO TB24
30.0000 mg | ORAL_TABLET | Freq: Every day | ORAL | Status: DC
  Administered 2016-03-03 – 2016-03-04 (×2): 30 mg via ORAL
  Filled 2016-03-03 (×2): qty 1

## 2016-03-03 MED ORDER — SERTRALINE HCL 50 MG PO TABS
50.0000 mg | ORAL_TABLET | Freq: Every day | ORAL | Status: DC
  Administered 2016-03-03 – 2016-03-04 (×2): 50 mg via ORAL
  Filled 2016-03-03 (×2): qty 1

## 2016-03-03 MED ORDER — SODIUM CHLORIDE 0.9% FLUSH
3.0000 mL | Freq: Two times a day (BID) | INTRAVENOUS | Status: DC
  Administered 2016-03-03: 3 mL via INTRAVENOUS

## 2016-03-03 MED ORDER — GUAIFENESIN 100 MG/5ML PO SYRP
200.0000 mg | ORAL_SOLUTION | Freq: Four times a day (QID) | ORAL | Status: DC | PRN
  Filled 2016-03-03: qty 10

## 2016-03-03 MED ORDER — OSELTAMIVIR PHOSPHATE 30 MG PO CAPS
30.0000 mg | ORAL_CAPSULE | Freq: Every day | ORAL | Status: DC
  Administered 2016-03-03: 30 mg via ORAL
  Filled 2016-03-03: qty 1

## 2016-03-03 MED ORDER — PE-SHARK LIVER OIL-COCOA BUTTR 0.25-3-85.5 % RE SUPP: 1.0000 | RECTAL | Status: DC | PRN

## 2016-03-03 MED ORDER — MUPIROCIN 2 % EX OINT
1.0000 "application " | TOPICAL_OINTMENT | Freq: Two times a day (BID) | CUTANEOUS | Status: DC
  Administered 2016-03-03 – 2016-03-04 (×2): 1 via NASAL
  Filled 2016-03-03 (×2): qty 22

## 2016-03-03 MED ORDER — HEPARIN SODIUM (PORCINE) 5000 UNIT/ML IJ SOLN
5000.0000 [IU] | Freq: Three times a day (TID) | INTRAMUSCULAR | Status: DC
  Administered 2016-03-03 (×2): 5000 [IU] via SUBCUTANEOUS
  Filled 2016-03-03 (×2): qty 1

## 2016-03-03 MED ORDER — OSELTAMIVIR PHOSPHATE 30 MG PO CAPS: 30.0000 mg | ORAL_CAPSULE | Freq: Two times a day (BID) | ORAL | Status: DC

## 2016-03-03 MED ORDER — FAMOTIDINE 20 MG PO TABS
20.0000 mg | ORAL_TABLET | Freq: Every day | ORAL | Status: DC
  Administered 2016-03-03: 20 mg via ORAL
  Filled 2016-03-03: qty 1

## 2016-03-03 MED ORDER — DONEPEZIL HCL 5 MG PO TABS
10.0000 mg | ORAL_TABLET | Freq: Every day | ORAL | Status: DC
  Administered 2016-03-03: 10 mg via ORAL
  Filled 2016-03-03: qty 2

## 2016-03-03 MED ORDER — CALCIUM CARBONATE ANTACID 500 MG PO CHEW
1.0000 | CHEWABLE_TABLET | Freq: Two times a day (BID) | ORAL | Status: DC
  Administered 2016-03-03 – 2016-03-04 (×3): 200 mg via ORAL
  Filled 2016-03-03 (×2): qty 1

## 2016-03-03 MED ORDER — ARIPIPRAZOLE 2 MG PO TABS
2.0000 mg | ORAL_TABLET | Freq: Every day | ORAL | Status: DC
  Administered 2016-03-03 – 2016-03-04 (×2): 2 mg via ORAL
  Filled 2016-03-03 (×3): qty 1

## 2016-03-03 MED ORDER — TRIPLE ANTIBIOTIC 3.5-400-5000 EX OINT
1.0000 "application " | TOPICAL_OINTMENT | CUTANEOUS | Status: DC | PRN
  Administered 2016-03-03: 1 via TOPICAL
  Filled 2016-03-03 (×2): qty 1

## 2016-03-03 MED ORDER — ENSURE ENLIVE PO LIQD
237.0000 mL | Freq: Two times a day (BID) | ORAL | Status: DC
  Administered 2016-03-03: 237 mL via ORAL

## 2016-03-03 MED ORDER — LORAZEPAM 0.5 MG PO TABS: 0.5000 mg | ORAL_TABLET | Freq: Two times a day (BID) | ORAL | Status: DC | PRN

## 2016-03-03 MED ORDER — ONDANSETRON HCL 4 MG/2ML IJ SOLN: 4.0000 mg | Freq: Four times a day (QID) | INTRAMUSCULAR | Status: DC | PRN

## 2016-03-03 MED ORDER — ACETAMINOPHEN 650 MG RE SUPP: 650.0000 mg | Freq: Four times a day (QID) | RECTAL | Status: DC | PRN

## 2016-03-03 MED ORDER — POTASSIUM CHLORIDE IN NACL 40-0.9 MEQ/L-% IV SOLN
INTRAVENOUS | Status: DC
  Administered 2016-03-03 (×2): 100 mL/h via INTRAVENOUS
  Filled 2016-03-03 (×5): qty 1000

## 2016-03-03 MED ORDER — CALCIUM CARBONATE 600 MG PO TABS: 600.0000 mg | ORAL_TABLET | Freq: Two times a day (BID) | ORAL | Status: DC

## 2016-03-03 MED ORDER — TRAMADOL HCL 50 MG PO TABS: 25.0000 mg | ORAL_TABLET | Freq: Three times a day (TID) | ORAL | Status: DC | PRN

## 2016-03-03 MED ORDER — DOCUSATE SODIUM 100 MG PO CAPS
100.0000 mg | ORAL_CAPSULE | Freq: Two times a day (BID) | ORAL | Status: DC
  Administered 2016-03-03 – 2016-03-04 (×3): 100 mg via ORAL
  Filled 2016-03-03 (×3): qty 1

## 2016-03-03 MED ORDER — ONDANSETRON HCL 4 MG PO TABS: 4.0000 mg | ORAL_TABLET | Freq: Four times a day (QID) | ORAL | Status: DC | PRN

## 2016-03-03 MED ORDER — ALUM & MAG HYDROXIDE-SIMETH 200-200-20 MG/5ML PO SUSP: 30.0000 mL | Freq: Four times a day (QID) | ORAL | Status: DC | PRN

## 2016-03-03 MED ORDER — ERYTHROMYCIN 5 MG/GM OP OINT
1.0000 "application " | TOPICAL_OINTMENT | Freq: Every day | OPHTHALMIC | Status: DC
  Administered 2016-03-03 – 2016-03-04 (×2): 1 via OPHTHALMIC
  Filled 2016-03-03: qty 3.5

## 2016-03-03 MED ORDER — ASPIRIN 81 MG PO CHEW
324.0000 mg | CHEWABLE_TABLET | Freq: Once | ORAL | Status: AC
  Administered 2016-03-03: 324 mg via ORAL
  Filled 2016-03-03: qty 4

## 2016-03-03 MED ORDER — LOPERAMIDE HCL 2 MG PO CAPS: 2.0000 mg | ORAL_CAPSULE | ORAL | Status: DC | PRN

## 2016-03-03 MED ORDER — CHLORHEXIDINE GLUCONATE CLOTH 2 % EX PADS
6.0000 | MEDICATED_PAD | Freq: Every day | CUTANEOUS | Status: DC
  Administered 2016-03-03: 6 via TOPICAL

## 2016-03-03 MED ORDER — MAGNESIUM HYDROXIDE 400 MG/5ML PO SUSP: 30.0000 mL | Freq: Every evening | ORAL | Status: DC | PRN

## 2016-03-03 MED ORDER — ACETAMINOPHEN 325 MG PO TABS: 650.0000 mg | ORAL_TABLET | Freq: Four times a day (QID) | ORAL | Status: DC | PRN

## 2016-03-03 MED ORDER — TRAZODONE HCL 50 MG PO TABS
25.0000 mg | ORAL_TABLET | Freq: Every day | ORAL | Status: DC
  Administered 2016-03-03: 25 mg via ORAL
  Filled 2016-03-03: qty 1

## 2016-03-03 MED ORDER — AMLODIPINE BESYLATE 5 MG PO TABS
10.0000 mg | ORAL_TABLET | Freq: Every day | ORAL | Status: DC
  Administered 2016-03-03 – 2016-03-04 (×2): 10 mg via ORAL
  Filled 2016-03-03 (×2): qty 2

## 2016-03-03 NOTE — Progress Notes (Signed)
[facility] [place] [facility]   PATIENT NAME: [name]    MR#:  [id number]  DATE OF BIRTH:  [birth year]  SUBJECTIVE:  CHIEF COMPLAINT:   Chief Complaint  Patient presents with  . Tremors  . Weakness     Sent from NH with weakness.   No complains due to dementia.   There was some concern about cardiac problem , so on tele. REVIEW OF SYSTEMS:  CONSTITUTIONAL: No fever, fatigue or weakness.  EYES: No blurred or double vision.  EARS, NOSE, AND THROAT: No tinnitus or ear pain.  RESPIRATORY: No cough, shortness of breath, wheezing or hemoptysis.  CARDIOVASCULAR: No chest pain, orthopnea, edema.  GASTROINTESTINAL: No nausea, vomiting, diarrhea or abdominal pain.  GENITOURINARY: No dysuria, hematuria.  ENDOCRINE: No polyuria, nocturia,  HEMATOLOGY: No anemia, easy bruising or bleeding SKIN: No rash or lesion. MUSCULOSKELETAL: No joint pain or arthritis.   NEUROLOGIC: No tingling, numbness, weakness.  PSYCHIATRY: No anxiety or depression.   ROS  DRUG ALLERGIES:   Allergies  Allergen Reactions  . Codeine Other (See Comments)    Reaction: unknown  . Sulfa Antibiotics Hives    VITALS:  Blood pressure 125/71, pulse 63, temperature 97.6 F (36.4 C), temperature source Oral, resp. rate 18, height 5\' 5"  (1.651 m), weight 42 kg (92 lb 11.2 oz), SpO2 96 %.  PHYSICAL EXAMINATION:  GENERAL:  83 y.o.-year-old patient lying in the bed with no acute distress.  EYES: Pupils equal, round, reactive to light and accommodation. No scleral icterus. Extraocular muscles intact.  HEENT: Head atraumatic, normocephalic. Oropharynx and nasopharynx clear.  NECK:  Supple, no jugular venous distention. No thyroid enlargement, no tenderness.  LUNGS: Normal breath sounds bilaterally, no wheezing, rales,rhonchi or crepitation. No use of accessory muscles of respiration.  CARDIOVASCULAR: S1, S2 normal. positive murmurs .  ABDOMEN: Soft, nontender, nondistended. Bowel sounds  present. No organomegaly or mass.  EXTREMITIES: No pedal edema, cyanosis, or clubbing.  NEUROLOGIC: Cranial nerves II through XII are intact. Muscle strength 3-4/5 in all extremities. Sensation intact. Gait not checked.  PSYCHIATRIC: The patient is alert and oriented x 1.  SKIN: No obvious rash, lesion, or ulcer.   Physical Exam LABORATORY PANEL:   CBC  Recent Labs Lab 03/02/16 2154  WBC 7.8  HGB 11.2*  HCT 33.8*  PLT 233   ------------------------------------------------------------------------------------------------------------------  Chemistries   Recent Labs Lab 03/02/16 2154  NA 138  K 3.3*  CL 107  CO2 24  GLUCOSE 81  BUN 29*  CREATININE 2.09*  CALCIUM 8.4*   ------------------------------------------------------------------------------------------------------------------  Cardiac Enzymes  Recent Labs Lab 03/03/16 1120 03/03/16 1558  TROPONINI 0.03* 0.03*   ------------------------------------------------------------------------------------------------------------------  RADIOLOGY:  Ct Head Wo Contrast  Result Date: 03/03/2016 CLINICAL DATA:  Initial evaluation for new onset tremors with containing to left. EXAM: CT HEAD WITHOUT CONTRAST TECHNIQUE: Contiguous axial images were obtained from the base of the skull through the vertex without intravenous contrast. COMPARISON:  Prior CT from 02/22/2016. FINDINGS: Brain: Moderate cerebral and cerebellar atrophy with chronic microvascular ischemic disease noted. Scatter remote lacunar infarcts present within the basal ganglia and right thalamus. Remote left cerebellar infarcts. Changes are stable from previous. No acute intracranial hemorrhage. No evidence for acute large vessel territory infarct. No mass lesion, midline shift or mass effect. Ventricular prominence related to global parenchymal volume loss without hydrocephalus. No extra-axial fluid collection. Vascular: No hyperdense vessel. Prominent vascular  calcifications noted within the carotid siphons and distal vertebral arteries. Skull: Scalp soft tissues within  normal limits.  Calvarium intact. Sinuses/Orbits: Globes and orbital soft tissues within normal limits. Moderate mucosal thickening within the partially visualized right maxillary sinus, with additional opacity within the right frontal sinus and ethmoidal air cells. Paranasal sinuses are otherwise clear. No mastoid effusion. IMPRESSION: 1. No acute intracranial process identified. 2. Remote left cerebellar infarcts, with additional remote lacunar infarcts within the bilateral basal ganglia and right thalamus. 3. Moderate cerebral and cerebellar atrophy with chronic microvascular ischemic disease. Electronically Signed   By: Rise MuBenjamin  McClintock M.D.   On: 03/03/2016 00:01    ASSESSMENT AND PLAN:   Active Problems:   Elevated troponin  1. Elevated troponin: Secondary to demand ischemia as well as poor renal clearance. Followed cardiac enzymes- stable. Monitor telemetry. Ruled out cardiogenic causes of syncope (although it is unclear if the patient syncopized). Echocardiogram ordered. Continue Imdur   Check infection work up to rule out as a reason for weakness. 2. Chronic kidney disease: Stage IV; avoid nephrotoxic agents. Hydrate with intravenous saline 3. Dementia: Continue Zoloft, Aricept and Abilify for mood stabilization. Trazodone for sleep aid 4. Fall: Fall precautions 5. Hypertension: Continue amlodipine and Lopressor 6. Ectropion: Continue erythromycin ointment 7. DVT prophylaxis: Heparin 8. GI prophylaxis: None   All the records are reviewed and case discussed with Care Management/Social Workerr. Management plans discussed with the patient, family and they are in agreement.  CODE STATUS: DNR  TOTAL TIME TAKING CARE OF THIS PATIENT: 35 minutes.   Spoke to pt's daughter in room today/  POSSIBLE D/C IN 1-2 DAYS, DEPENDING ON CLINICAL CONDITION.   Altamese DillingVACHHANI, Kaisha Wachob  M.D on 03/03/2016   Between 7am to 6pm - Pager - [phone]  After 6pm go to [email or web address] - password [email or web address]  [facility]  Office  [phone]  CC: Primary care physician; Pcp Not In System  Note: This dictation was prepared with Dragon dictation along with smaller phrase technology. Any transcriptional errors that result from this process are unintentional.

## 2016-03-03 NOTE — Progress Notes (Signed)
Pts influenza A PCR positive. Pt placed on droplet precautions, MD Patel notified. Orders received for Tamiflu 30 mg BID. Order placed.

## 2016-03-03 NOTE — ED Notes (Signed)
Report off to butch rn  

## 2016-03-03 NOTE — Care Management Obs Status (Signed)
MEDICARE OBSERVATION STATUS NOTIFICATION   Patient Details  Name: [name] MRN: [name] Date of Birth: [birth year]   Medicare Observation Status Notification Given:  Yes  Notified Daughter Madge.  Collie SiadAngela Kalei Meda, RN 03/03/2016, 9:13 AM

## 2016-03-03 NOTE — Consult Note (Signed)
[facility]  CARDIOLOGY CONSULT NOTE  Patient ID: Denise MenghiniMolly Neal MRN: [id number] DOB/AGE: Sep 22, 1921 83 y.o.  Admit date: 03/02/2016 Referring Physician Dr. Elisabeth PigeonVachhani Primary Physician   Primary Cardiologist   Reason for Consultation abnormal troponin and fall  HPI: Pt is a 83 yo female with history of dementia who is a resident of a nh admitted after a fall felt to be possible syncope. Her serum troponin was 0.03. EKG showed nsr with no ischemia. Head ct revealed no acute abnormalities. Echo is pending.  Influenza a pcr was positive.   Review of Systems  Unable to perform ROS: Mental acuity    Past Medical History:  Diagnosis Date  . Anemia   . Arthritis   . Dementia   . Diabetes mellitus without complication (HCC)   . Essential (primary) hypertension   . Hyperlipidemia   . Old myocardial infarction   . Rhabdomyolysis     Family History  Problem Relation Age of Onset  . CAD Father   . Prostate cancer Brother     Social History   Social History  . Marital status: Widowed    Spouse name: N/A  . Number of children: N/A  . Years of education: N/A   Occupational History  . Not on file.   Social History Main Topics  . Smoking status: Never Smoker  . Smokeless tobacco: Never Used  . Alcohol use No  . Drug use: No  . Sexual activity: No   Other Topics Concern  . Not on file   Social History Narrative   From [facility]   Ambulates with a walker. Very slow walking   Needs assistance with feeding    Past Surgical History:  Procedure Laterality Date  . 2000- hip replacement    . APPENDECTOMY    . broke femur  2015   right  . CESAREAN SECTION    . Left hip surgery    . right arm surgery- bone grafts and 3 plates Left [id number]     Prescriptions Prior to Admission  Medication Sig Dispense Refill Last Dose  . acetaminophen (TYLENOL) 500 MG tablet Take 500 mg by mouth every 4 (four) hours as needed for  mild pain, fever or headache.   prn  . alum & mag hydroxide-simeth (MAALOX/MYLANTA) 200-200-20 MG/5ML suspension Take 30 mLs by mouth every 6 (six) hours as needed for indigestion or heartburn.   prn  . amLODipine (NORVASC) 10 MG tablet Take 1 tablet (10 mg total) by mouth daily. 30 tablet 2 unknown  . ARIPiprazole (ABILIFY) 2 MG tablet Take 2 mg by mouth daily.   unknown  . calcium carbonate (OS-CAL) 600 MG TABS tablet Take 600 mg by mouth 2 (two) times daily with a meal.   unknown  . Cholecalciferol (VITAMIN D) 2000 UNITS CAPS Take 2,000 Units by mouth daily.   unknown  . docusate sodium (COLACE) 100 MG capsule Take 1 capsule (100 mg total) by mouth 2 (two) times daily. Hold for > 2 bowel movements in a day 30 capsule 0 unknown  . donepezil (ARICEPT) 10 MG tablet Take 10 mg by mouth at bedtime.   unknown  . erythromycin ophthalmic ointment Place 1 application into the left eye daily. Apply 2-4 times every day.   unknown  . guaifenesin (ROBITUSSIN) 100 MG/5ML syrup Take 200 mg by mouth 4 (four) times daily as needed for cough.   prn  . isosorbide mononitrate (IMDUR) 30 MG 24  hr tablet Take 1 tablet (30 mg total) by mouth daily. 30 tablet 2 unknown  . loperamide (IMODIUM) 2 MG capsule Take 2 mg by mouth as needed for diarrhea or loose stools.   prn  . LORazepam (ATIVAN) 0.5 MG tablet Take 0.5 mg by mouth 2 (two) times daily as needed (agitation).   prn  . magnesium hydroxide (MILK OF MAGNESIA) 400 MG/5ML suspension Take 30 mLs by mouth at bedtime as needed for mild constipation.   unknown  . metoprolol (LOPRESSOR) 50 MG tablet Take 1 tablet (50 mg total) by mouth 2 (two) times daily. 60 tablet 0 unknown  . neomycin-bacitracin-polymyxin (NEOSPORIN) [phone] ointment Apply 1 application topically as needed (wound care).   prn  . ondansetron (ZOFRAN) 4 MG tablet Take 4 mg by mouth every 8 (eight) hours as needed for nausea or vomiting.   prn  . ranitidine (ZANTAC) 150 MG tablet Take 150 mg by mouth  at bedtime.   prn  . sertraline (ZOLOFT) 50 MG tablet Take 50 mg by mouth daily.   unknown  . shark liver oil-cocoa butter (PREPARATION H) 0.25-3-85.5 % suppository Place 1 suppository rectally every 4 (four) hours as needed for hemorrhoids.    prn  . traMADol (ULTRAM) 50 MG tablet Take 25 mg by mouth every 8 (eight) hours as needed for moderate pain or severe pain.   prn  . traZODone (DESYREL) 50 MG tablet Take 25 mg by mouth at bedtime.   unknown    Physical Exam: Blood pressure 138/70, pulse 68, temperature 98.2 F (36.8 C), temperature source Oral, resp. rate 18, height 5\' 5"  (1.651 m), weight 92 lb 11.2 oz (42 kg), SpO2 96 %.   Wt Readings from Last 1 Encounters:  03/03/16 92 lb 11.2 oz (42 kg)     General appearance: slowed mentation Resp: clear to auscultation bilaterally Cardio: regular rate and rhythm GI: soft, non-tender; bowel sounds normal; no masses,  no organomegaly Extremities: extremities normal, atraumatic, no cyanosis or edema Neurologic: Mental status: alertness: lethargic  Labs:   Lab Results  Component Value Date   WBC 7.8 03/02/2016   HGB 11.2 (L) 03/02/2016   HCT 33.8 (L) 03/02/2016   MCV 80.4 03/02/2016   PLT 233 03/02/2016    Recent Labs Lab 03/02/16 2154  NA 138  K 3.3*  CL 107  CO2 24  BUN 29*  CREATININE 2.09*  CALCIUM 8.4*  GLUCOSE 81   Lab Results  Component Value Date   CKTOTAL 72 07/29/2013   CKMB 37.9 (H) 05/10/2008   TROPONINI 0.03 (HH) 03/03/2016       EKG: nsr with no ischemia  ASSESSMENT AND PLAN:  Pt with dementia and apparent fall. Asked to see regarding abnormal troponin. Levels were trivially abnormal at 0.03. No evidence of ischemia. Echo will be reviewed when available. No further cardiac workup indicated. No evidence of cardiac etiology of fall.  Signed: Dalia Heading MD, [facility] 03/03/2016, 9:05 PM

## 2016-03-03 NOTE — Progress Notes (Signed)
*  PRELIMINARY RESULTS* Echocardiogram 2D Echocardiogram has been performed.  Cristela BlueHege, Raziyah Vanvleck 03/03/2016, 6:45 PM

## 2016-03-03 NOTE — H&P (Signed)
[name] is an 83 y.o. female.   Chief Complaint: Weakness HPI: The patient with past medical history of coronary artery disease, diabetes and dementia presents to the emergency department after some a fall. It is not clear if this fall was witnessed by the patient has no recollection of it. Due to dementia she cannot contribute much to her past medical history. Currently she denies pain or weakness. Laboratory evaluation revealed barely positive troponin which prompted the emergency department staff to call the hospitalist service for admission.  Past Medical History:  Diagnosis Date  . Anemia   . Arthritis   . Dementia   . Diabetes mellitus without complication ([place])   . Essential (primary) hypertension   . Hyperlipidemia   . Old myocardial infarction   . Rhabdomyolysis     Past Surgical History:  Procedure Laterality Date  . 2000- hip replacement    . APPENDECTOMY    . broke femur  2015   right  . CESAREAN SECTION    . Left hip surgery    . right arm surgery- bone grafts and 3 plates Left [id number]    Family History  Problem Relation Age of Onset  . CAD Father   . Prostate cancer Brother    Social History:  reports that she has never smoked. She has never used smokeless tobacco. She reports that she does not drink alcohol or use drugs.  Allergies:  Allergies  Allergen Reactions  . Codeine Other (See Comments)    Reaction: unknown  . Sulfa Antibiotics Hives    Medications Prior to Admission  Medication Sig Dispense Refill  . acetaminophen (TYLENOL) 500 MG tablet Take 500 mg by mouth every 4 (four) hours as needed for mild pain, fever or headache.    Marland Kitchen alum & mag hydroxide-simeth (MAALOX/MYLANTA) 200-200-20 MG/5ML suspension Take 30 mLs by mouth every 6 (six) hours as needed for indigestion or heartburn.    Marland Kitchen amLODipine (NORVASC) 10 MG tablet Take 1 tablet (10 mg total) by mouth daily. 30 tablet 2  . ARIPiprazole (ABILIFY) 2 MG tablet Take 2 mg by mouth daily.    .  calcium carbonate (OS-CAL) 600 MG TABS tablet Take 600 mg by mouth 2 (two) times daily with a meal.    . Cholecalciferol (VITAMIN D) 2000 UNITS CAPS Take 2,000 Units by mouth daily.    Marland Kitchen docusate sodium (COLACE) 100 MG capsule Take 1 capsule (100 mg total) by mouth 2 (two) times daily. Hold for > 2 bowel movements in a day 30 capsule 0  . donepezil (ARICEPT) 10 MG tablet Take 10 mg by mouth at bedtime.    Marland Kitchen erythromycin ophthalmic ointment Place 1 application into the left eye daily. Apply 2-4 times every day.    . guaifenesin (ROBITUSSIN) 100 MG/5ML syrup Take 200 mg by mouth 4 (four) times daily as needed for cough.    . isosorbide mononitrate (IMDUR) 30 MG 24 hr tablet Take 1 tablet (30 mg total) by mouth daily. 30 tablet 2  . loperamide (IMODIUM) 2 MG capsule Take 2 mg by mouth as needed for diarrhea or loose stools.    Marland Kitchen LORazepam (ATIVAN) 0.5 MG tablet Take 0.5 mg by mouth 2 (two) times daily as needed (agitation).    . magnesium hydroxide (MILK OF MAGNESIA) 400 MG/5ML suspension Take 30 mLs by mouth at bedtime as needed for mild constipation.    . metoprolol (LOPRESSOR) 50 MG tablet Take 1 tablet (50 mg total) by mouth 2 (two) times  daily. 60 tablet 0  . neomycin-bacitracin-polymyxin (NEOSPORIN) [phone] ointment Apply 1 application topically as needed (wound care).    . ondansetron (ZOFRAN) 4 MG tablet Take 4 mg by mouth every 8 (eight) hours as needed for nausea or vomiting.    . ranitidine (ZANTAC) 150 MG tablet Take 150 mg by mouth at bedtime.    . sertraline (ZOLOFT) 50 MG tablet Take 50 mg by mouth daily.    . shark liver oil-cocoa butter (PREPARATION H) 0.25-3-85.5 % suppository Place 1 suppository rectally every 4 (four) hours as needed for hemorrhoids.     . traMADol (ULTRAM) 50 MG tablet Take 25 mg by mouth every 8 (eight) hours as needed for moderate pain or severe pain.    . traZODone (DESYREL) 50 MG tablet Take 25 mg by mouth at bedtime.      Results for orders placed or  performed during the hospital encounter of 03/02/16 (from the past 48 hour(s))  Basic metabolic panel     Status: Abnormal   Collection Time: 03/02/16  9:54 PM  Result Value Ref Range   Sodium 138 135 - 145 mmol/L   Potassium 3.3 (L) 3.5 - 5.1 mmol/L   Chloride 107 101 - 111 mmol/L   CO2 24 22 - 32 mmol/L   Glucose, Bld 81 65 - 99 mg/dL   BUN 29 (H) 6 - 20 mg/dL   Creatinine, Ser 2.09 (H) 0.44 - 1.00 mg/dL   Calcium 8.4 (L) 8.9 - 10.3 mg/dL   GFR calc non Af Amer 19 (L) >60 mL/min   GFR calc Af Amer 22 (L) >60 mL/min    Comment: (NOTE) The eGFR has been calculated using the CKD EPI equation. This calculation has not been validated in all clinical situations. eGFR's persistently <60 mL/min signify possible Chronic Kidney Disease.    Anion gap 7 5 - 15  CBC     Status: Abnormal   Collection Time: 03/02/16  9:54 PM  Result Value Ref Range   WBC 7.8 3.6 - 11.0 K/uL   RBC 4.20 3.80 - 5.20 MIL/uL   Hemoglobin 11.2 (L) 12.0 - 16.0 g/dL   HCT 33.8 (L) 35.0 - 47.0 %   MCV 80.4 80.0 - 100.0 fL   MCH 26.6 26.0 - 34.0 pg   MCHC 33.1 32.0 - 36.0 g/dL   RDW 14.8 (H) 11.5 - 14.5 %   Platelets 233 150 - 440 K/uL  Troponin I     Status: Abnormal   Collection Time: 03/02/16  9:54 PM  Result Value Ref Range   Troponin I 0.03 (HH) <0.03 ng/mL    Comment: CRITICAL VALUE NOTED. VALUE IS CONSISTENT WITH PREVIOUSLY REPORTED/CALLED VALUE TLB   MRSA PCR Screening     Status: Abnormal   Collection Time: 03/03/16  5:00 AM  Result Value Ref Range   MRSA by PCR POSITIVE (A) NEGATIVE    Comment:        The GeneXpert MRSA Assay (FDA approved for NASAL specimens only), is one component of a comprehensive MRSA colonization surveillance program. It is not intended to diagnose MRSA infection nor to guide or monitor treatment for MRSA infections. RESULT CALLED TO, READ BACK BY AND VERIFIED WITH:  TERESA COBLE AT 0615 03/03/16 SDR   TSH     Status: None   Collection Time: 03/03/16  6:11 AM   Result Value Ref Range   TSH 1.648 0.350 - 4.500 uIU/mL    Comment: Performed by a 3rd Generation assay with  a functional sensitivity of <=0.01 uIU/mL.  Troponin I     Status: Abnormal   Collection Time: 03/03/16  6:11 AM  Result Value Ref Range   Troponin I 0.03 (HH) <0.03 ng/mL    Comment: CRITICAL RESULT CALLED TO, READ BACK BY AND VERIFIED WITH JENNIFER COBLE ON 03/03/16 AT 0706 BY TLB    Ct Head Wo Contrast  Result Date: 03/03/2016 CLINICAL DATA:  Initial evaluation for new onset tremors with containing to left. EXAM: CT HEAD WITHOUT CONTRAST TECHNIQUE: Contiguous axial images were obtained from the base of the skull through the vertex without intravenous contrast. COMPARISON:  Prior CT from 02/22/2016. FINDINGS: Brain: Moderate cerebral and cerebellar atrophy with chronic microvascular ischemic disease noted. Scatter remote lacunar infarcts present within the basal ganglia and right thalamus. Remote left cerebellar infarcts. Changes are stable from previous. No acute intracranial hemorrhage. No evidence for acute large vessel territory infarct. No mass lesion, midline shift or mass effect. Ventricular prominence related to global parenchymal volume loss without hydrocephalus. No extra-axial fluid collection. Vascular: No hyperdense vessel. Prominent vascular calcifications noted within the carotid siphons and distal vertebral arteries. Skull: Scalp soft tissues within normal limits.  Calvarium intact. Sinuses/Orbits: Globes and orbital soft tissues within normal limits. Moderate mucosal thickening within the partially visualized right maxillary sinus, with additional opacity within the right frontal sinus and ethmoidal air cells. Paranasal sinuses are otherwise clear. No mastoid effusion. IMPRESSION: 1. No acute intracranial process identified. 2. Remote left cerebellar infarcts, with additional remote lacunar infarcts within the bilateral basal ganglia and right thalamus. 3. Moderate cerebral  and cerebellar atrophy with chronic microvascular ischemic disease. Electronically Signed   By: Jeannine Boga M.D.   On: 03/03/2016 00:01    Review of Systems  Unable to perform ROS: Dementia    Blood pressure (!) 175/80, pulse 82, temperature 97.5 F (36.4 C), temperature source Oral, resp. rate 18, height '5\' 5"'$  (1.651 m), weight 42 kg (92 lb 11.2 oz), SpO2 100 %. Physical Exam  Vitals reviewed. Constitutional: She is oriented to person, place, and time. She appears well-developed and well-nourished. No distress.  HENT:  Head: Normocephalic and atraumatic.  Mouth/Throat: Oropharynx is clear and moist.  Eyes: Conjunctivae and EOM are normal. Pupils are equal, round, and reactive to light. No scleral icterus.  Ectropion of left eyelid; crusting (likley due to antibiotic ointment)  Neck: Normal range of motion. Neck supple. No JVD present. No tracheal deviation present. No thyromegaly present.  Cardiovascular: Normal rate, regular rhythm and normal heart sounds.  Exam reveals no gallop and no friction rub.   No murmur heard. Respiratory: Effort normal and breath sounds normal.  GI: Soft. Bowel sounds are normal. She exhibits no distension. There is no tenderness.  Genitourinary:  Genitourinary Comments: Deferred  Musculoskeletal: Normal range of motion. She exhibits no edema.  Lymphadenopathy:    She has no cervical adenopathy.  Neurological: She is alert and oriented to person, place, and time. No cranial nerve deficit. She exhibits normal muscle tone.  Skin: Skin is warm and dry. No rash noted. No erythema.  Psychiatric: She has a normal mood and affect. Her behavior is normal. Judgment and thought content normal.     Assessment/Plan This is a 83 year old female admitted for elevated troponin. 1. Elevated troponin: Secondary to demand ischemia as well as poor renal clearance. Follow cardiac enzymes. Monitor telemetry. Rule out cardiogenic causes of syncope (although it is  unclear if the patient syncopized). Echocardiogram ordered. Continue Imdur 2. Chronic  kidney disease: Stage IV; avoid nephrotoxic agents. Hydrate with intravenous saline 3. Dementia: Continue Zoloft, Aricept and Abilify for mood stabilization. Trazodone for sleep aid 4. Fall: Fall precautions 5. Hypertension: Continue amlodipine and Lopressor 6. Ectropion: Continue erythromycin ointment 7. DVT prophylaxis: Heparin 8. GI prophylaxis: None The patient is a DO NOT RESUSCITATE. Time spent on admission orders and patient care approximately 45 minutes  Harrie Foreman, MD 03/03/2016, 7:43 AM

## 2016-03-03 NOTE — Care Management Note (Signed)
Case Management Note  Patient Details  Name: Rockne MenghiniMolly Lantis MRN: [id number] Date of Birth: 1921/03/09  Subjective/Objective:                  Spoke with patient's daughter Madge by phone. We talked about patient's status and discharge plan along with patient admission status as OBServation. She would like for patient to go back to [facility] where she was ambulatory with walker to dining room/activities. She was supposed to be getting physical therapy and [facility] uses [facility] so I am checking with them on that status. [facility] has been talking to Devon EnergyMadge about hospice services at [facility]- she does not want that at this time was the impression I received. She however did state that "every time [facility] calls me I'm expecting that she has passed".  Action/Plan:  MOON letter left at patient's bedside for Madge. Message to [facility] regarding HHPT. CSW consult place. RNCM will continue to follow.   Expected Discharge Date:                  Expected Discharge Plan:     In-House Referral:  Clinical Social Work  Discharge planning Services  CM Consult  Post Acute Care Choice:    Choice offered to:  Adult Children  DME Arranged:    DME Agency:     HH Arranged:  PT [facility]  Status of Service:  In process, will continue to follow  If discussed at Long Length of Stay Meetings, dates discussed:    Additional Comments:  Collie Siadngela Donnette Macmullen, RN 03/03/2016, 9:02 AM

## 2016-03-03 NOTE — ED Notes (Signed)
Pt sleeping. 

## 2016-03-03 NOTE — [place] FL2 (Signed)
[place] MEDICAID FL2 LEVEL OF CARE SCREENING TOOL     IDENTIFICATION  Patient Name: Denise MenghiniMolly Hey Birthdate: [birth year] Sex: female Admission Date (Current Location): 03/02/2016  [place]   Facility and Address:  [facility], [address], [place], [place] [address]      Provider Number: [id number]  Attending Physician Name and Address:  Altamese DillingVaibhavkumar Early Ord, MD  Relative Name and Phone Number:       Current Level of Care: Hospital Recommended Level of Care: Assisted Living Facility [facility]) Prior Approval Number:    Date Approved/Denied:   PASRR Number:   [id number]   Discharge Plan: Other (Comment) ([facility])    Current Diagnoses: Patient Active Problem List   Diagnosis Date Noted  . Elevated troponin 03/03/2016  . ARF (acute renal failure) (HCC) 07/11/2015  . UTI (lower urinary tract infection) 06/23/2015  . Cholecystitis 05/29/2015  . Pressure ulcer 05/29/2015  . Old myocardial infarction 08/29/2013  . Essential hypertension 08/29/2013  . Hyperlipidemia 08/29/2013    Orientation RESPIRATION BLADDER Height & Weight     Self  Normal Incontinent Weight: 92 lb 11.2 oz (42 kg) Height:  5\' 5"  (165.1 cm)  BEHAVIORAL SYMPTOMS/MOOD NEUROLOGICAL BOWEL NUTRITION STATUS   (None. )  (None.) Continent Diet (Diet: Heart Room )  AMBULATORY STATUS COMMUNICATION OF NEEDS Skin   Limited Assist Verbally Normal                       Personal Care Assistance Level of Assistance  Bathing, Feeding, Dressing Bathing Assistance: Limited assistance Feeding assistance: Independent Dressing Assistance: Limited assistance     Functional Limitations Info  Sight, Hearing, Speech Sight Info: Adequate Hearing Info: Adequate Speech Info: Impaired (Missing Teeth )    SPECIAL CARE FACTORS FREQUENCY                      Contractures      Additional Factors Info  Code Status,  Allergies Code Status Info:  (DNR) Allergies Info:  (Codeine, Sulfa Antibiotics)           Current Medications (03/03/2016):  This is the current hospital active medication list Current Facility-Administered Medications  Medication Dose Route Frequency Provider Last Rate Last Dose  . 0.9 % NaCl with KCl 40 mEq / L  infusion   Intravenous Continuous Arnaldo NatalMichael S Diamond, MD 100 mL/hr at 03/03/16 0501 100 mL/hr at 03/03/16 0501  . acetaminophen (TYLENOL) tablet 650 mg  650 mg Oral Q6H PRN Arnaldo NatalMichael S Diamond, MD       Or  . acetaminophen (TYLENOL) suppository 650 mg  650 mg Rectal Q6H PRN Arnaldo NatalMichael S Diamond, MD      . alum & mag hydroxide-simeth (MAALOX/MYLANTA) 200-200-20 MG/5ML suspension 30 mL  30 mL Oral Q6H PRN Arnaldo NatalMichael S Diamond, MD      . amLODipine (NORVASC) tablet 10 mg  10 mg Oral Daily Arnaldo NatalMichael S Diamond, MD   10 mg at 03/03/16 1021  . ARIPiprazole (ABILIFY) tablet 2 mg  2 mg Oral Daily Arnaldo NatalMichael S Diamond, MD   2 mg at 03/03/16 1022  . calcium carbonate (TUMS - dosed in mg elemental calcium) chewable tablet 200 mg of elemental calcium  1 tablet Oral BID WC Arnaldo NatalMichael S Diamond, MD   200 mg of elemental calcium at 03/03/16 1021  . Chlorhexidine Gluconate Cloth 2 % PADS 6 each  6 each Topical Q0600 Darci Currentandolph N Brown,  MD   6 each at 03/03/16 1022  . cholecalciferol (VITAMIN D) tablet 2,000 Units  2,000 Units Oral Daily Arnaldo Natal, MD   2,000 Units at 03/03/16 1021  . docusate sodium (COLACE) capsule 100 mg  100 mg Oral BID Arnaldo Natal, MD   100 mg at 03/03/16 1022  . donepezil (ARICEPT) tablet 10 mg  10 mg Oral QHS Arnaldo Natal, MD      . erythromycin ophthalmic ointment 1 application  1 application Left Eye Daily Arnaldo Natal, MD   1 application at 03/03/16 1022  . famotidine (PEPCID) tablet 20 mg  20 mg Oral QHS Arnaldo Natal, MD      . feeding supplement (ENSURE ENLIVE) (ENSURE ENLIVE) liquid 237 mL  237 mL Oral BID BM Altamese Dilling, MD      . guaifenesin  (ROBITUSSIN) 100 MG/5ML syrup 200 mg  200 mg Oral QID PRN Arnaldo Natal, MD      . heparin injection 5,000 Units  5,000 Units Subcutaneous Q8H Arnaldo Natal, MD   5,000 Units at 03/03/16 1020  . isosorbide mononitrate (IMDUR) 24 hr tablet 30 mg  30 mg Oral Daily Arnaldo Natal, MD   30 mg at 03/03/16 1021  . loperamide (IMODIUM) capsule 2 mg  2 mg Oral PRN Arnaldo Natal, MD      . LORazepam (ATIVAN) tablet 0.5 mg  0.5 mg Oral BID PRN Arnaldo Natal, MD      . magnesium hydroxide (MILK OF MAGNESIA) suspension 30 mL  30 mL Oral QHS PRN Arnaldo Natal, MD      . metoprolol (LOPRESSOR) tablet 50 mg  50 mg Oral BID Arnaldo Natal, MD   50 mg at 03/03/16 1021  . mupirocin ointment (BACTROBAN) 2 % 1 application  1 application Nasal BID Darci Current, MD      . ondansetron [facility]) tablet 4 mg  4 mg Oral Q6H PRN Arnaldo Natal, MD       Or  . ondansetron [facility]) injection 4 mg  4 mg Intravenous Q6H PRN Arnaldo Natal, MD      . sertraline (ZOLOFT) tablet 50 mg  50 mg Oral Daily Arnaldo Natal, MD   50 mg at 03/03/16 1021  . shark liver oil-cocoa butter (PREPARATION H) 1 suppository  1 suppository Rectal Q4H PRN Arnaldo Natal, MD      . sodium chloride flush (NS) 0.9 % injection 3 mL  3 mL Intravenous Q12H Arnaldo Natal, MD      . traMADol Janean Sark) tablet 25 mg  25 mg Oral Q8H PRN Arnaldo Natal, MD      . traZODone (DESYREL) tablet 25 mg  25 mg Oral QHS Arnaldo Natal, MD      . TRIPLE ANTIBIOTIC [phone] OINT 1 application  1 application Topical PRN Arnaldo Natal, MD         Discharge Medications: Please see discharge summary for a list of discharge medications.  Relevant Imaging Results:  Relevant Lab Results:   Additional Information  (SSN: [id number])  Sample, Darleen Crocker, LCSW

## 2016-03-03 NOTE — Progress Notes (Signed)
Initial Nutrition Assessment  DOCUMENTATION CODES:   Severe malnutrition in context of chronic illness  INTERVENTION:  1. Ensure Enlive po BID, each supplement provides 350 kcal and 20 grams of protein  NUTRITION DIAGNOSIS:   Malnutrition related to chronic illness as evidenced by severe depletion of muscle mass, severe depletion of body fat.  GOAL:   Patient will meet greater than or equal to 90% of their needs  MONITOR:   PO intake, I & O's, Labs, Weight trends, Supplement acceptance  REASON FOR ASSESSMENT:   Malnutrition Screening Tool    ASSESSMENT:   The patient with past medical history of coronary artery disease, diabetes and dementia presents to the emergency department after some a fall.   Attempted to speak with patient at bedside. She was complaining, calling out about food during visit. Unable to provide history. No documented meal completion thus far. Nutrition-Focused physical exam completed. Findings are severe fat depletion, severe muscle depletion, and no edema.  Labs and medications reviewed: K 3.3 Ca-Carbonate, Vitamin D Colace NS w/ KCL @ 16100mL/hr  Diet Order:  Diet Heart Room service appropriate? Yes; Fluid consistency: Thin  Skin:  Reviewed, no issues  Last BM:  PTA  Height:   Ht Readings from Last 1 Encounters:  03/02/16 5\' 5"  (1.651 m)    Weight:   Wt Readings from Last 1 Encounters:  03/03/16 92 lb 11.2 oz (42 kg)    Ideal Body Weight:  56.81 kg  BMI:  Body mass index is 15.43 kg/m.  Estimated Nutritional Needs:   Kcal:  1000-1166 calories (MSJ x1.2-1.4)  Protein:  40-50 gm  Fluid:  >/= 1L  EDUCATION NEEDS:   No education needs identified at this time  Dionne AnoWilliam M. Ivo Moga, MS, RD LDN Inpatient Clinical Dietitian Pager [phone]

## 2016-03-03 NOTE — Progress Notes (Signed)
Tamiflu Renal dosing:  83 yo F with Crcl = 11 ml/min,  Scr 2.09   Tamiflu 30mg  bid ordered.  For Crcl 10-30 ml/min recommended dosing is Tamiflu 30mg  po ONCE DAILY.  Will adjust dose to Tamiflu 30 mg po Daily.   Bari MantisKristin Christinia Lambeth PharmD Clinical Pharmacist 03/03/2016

## 2016-03-03 NOTE — Clinical Social Work Note (Signed)
Clinical Social Work Assessment  Patient Details  Name: [name] MRN: [id number] Date of Birth: 1922/01/17  Date of referral:  03/03/16               Reason for consult:  Facility Placement, Discharge Planning                Permission sought to share information with:  Chartered certified accountant granted to share information::  Yes, Verbal Permission Granted  Name::      [facility]  Agency::   [facility]   Relationship::     Contact Information:     Housing/Transportation Living arrangements for the past 2 months:  [facility] ([facility]) Source of Information:  Patient, Adult Children Patient Interpreter Needed:  None Criminal Activity/Legal Involvement Pertinent to Current Situation/Hospitalization:  No - Comment as needed Significant Relationships:  Adult Children Lives with:  Facility Resident [facility]) Do you feel safe going back to the place where you live?  Yes Need for family participation in patient care:  No (Coment)  Care giving concerns:  Patient is a resident in the memory care unit at [facility].    Social Worker assessment / plan:  Social work Theatre manager received social work consult. PT has not assessed patient at this time. Social work Theatre manager met with patient at bedside. Social work Theatre manager introduced herself and explained roles of social work department. Patient was sitting up in bed. Patient was confused. Social work Theatre manager contacted [facility] and talked with a Chartered certified accountant. Per the nurse tech, patient has been a resident in their memory care unit for about 4-5 months now. Nurse tech also confirmed that patient's HPOA is patient's daughter Madge. Per nurse tech patient is not on oxygen and walks with a walker. Nurse tech also said patient can return when patient is medically stable. Social work Theatre manager contacted patient's daughter Leavy Cella to discuss patient. Patient's daughter  confirmed the information above. Social work Theatre manager will continue to assist and follow as needed.   [facility] fax number is [phone]  Fl2 completed.   Employment status:  Unemployed Forensic scientist:  Medicare PT Recommendations:  Not assessed at this time Information / Referral to community resources:     Patient/Family's Response to care:  Patient's daughter and Chartered certified accountant are agreeable for patient to come back to [facility].   Patient/Family's Understanding of and Emotional Response to Diagnosis, Current Treatment, and Prognosis:  Patient's daughter and nurse tech were pleasant and thanked social work Theatre manager for calling.   Emotional Assessment Appearance:  Appears stated age Attitude/Demeanor/Rapport:    Affect (typically observed):  Accepting, Adaptable, Appropriate Orientation:  Oriented to Self Alcohol / Substance use:  Not Applicable Psych involvement (Current and /or in the community):  No (Comment)  Discharge Needs  Concerns to be addressed:  Basic Needs, Discharge Planning Concerns Readmission within the last 30 days:  No Current discharge risk:  Chronically ill Barriers to Discharge:  Continued Medical Work up   [employer], [place] Work 03/03/2016, 11:40 AM

## 2016-03-04 DIAGNOSIS — J101 Influenza due to other identified influenza virus with other respiratory manifestations: Secondary | ICD-10-CM | POA: Diagnosis not present

## 2016-03-04 LAB — BASIC METABOLIC PANEL
Anion gap: 7 (ref 5–15)
BUN: 21 mg/dL — ABNORMAL HIGH (ref 6–20)
CALCIUM: 9.1 mg/dL (ref 8.9–10.3)
CO2: 25 mmol/L (ref 22–32)
CREATININE: 1.87 mg/dL — AB (ref 0.44–1.00)
Chloride: 106 mmol/L (ref 101–111)
GFR calc non Af Amer: 22 mL/min — ABNORMAL LOW (ref >60)
GFR, EST AFRICAN AMERICAN: 26 mL/min — AB (ref >60)
Glucose, Bld: 65 mg/dL (ref 65–99)
Potassium: 4.5 mmol/L (ref 3.5–5.1)
SODIUM: 138 mmol/L (ref 135–145)

## 2016-03-04 LAB — HEMOGLOBIN A1C
Hgb A1c MFr Bld: 5.2 % (ref 4.8–5.6)
Mean Plasma Glucose: 103 mg/dL

## 2016-03-04 LAB — ECHOCARDIOGRAM COMPLETE
Height: 65 in
Weight: 1483.2 oz

## 2016-03-04 MED ORDER — OSELTAMIVIR PHOSPHATE 30 MG PO CAPS: 30.0000 mg | ORAL_CAPSULE | Freq: Every day | ORAL | 0 refills | Status: AC

## 2016-03-04 MED ORDER — ENSURE ENLIVE PO LIQD: 237.0000 mL | Freq: Two times a day (BID) | ORAL | 12 refills | Status: AC

## 2016-03-04 MED ORDER — OSELTAMIVIR PHOSPHATE 30 MG PO CAPS: 30.0000 mg | ORAL_CAPSULE | Freq: Two times a day (BID) | ORAL | Status: DC

## 2016-03-04 MED ORDER — TRAMADOL HCL 50 MG PO TABS: 25.0000 mg | ORAL_TABLET | Freq: Three times a day (TID) | ORAL | 0 refills | Status: AC | PRN

## 2016-03-04 NOTE — Discharge Summary (Signed)
[facility] - [place] at [facility]   PATIENT NAME: Basya Casavant    MR#:  [id number]  DATE OF BIRTH:  08-07-1921  DATE OF ADMISSION:  03/02/2016 ADMITTING PHYSICIAN: Arnaldo Natal, MD  DATE OF DISCHARGE: 03/04/2016  PRIMARY CARE PHYSICIAN: Pcp Not In System    ADMISSION DIAGNOSIS:  Cerebrovascular accident (CVA), unspecified mechanism (HCC) [I63.9]  DISCHARGE DIAGNOSIS:  Active Problems:   Elevated troponin   Influenza A   Generalized weakness.  SECONDARY DIAGNOSIS:   Past Medical History:  Diagnosis Date  . Anemia   . Arthritis   . Dementia   . Diabetes mellitus without complication (HCC)   . Essential (primary) hypertension   . Hyperlipidemia   . Old myocardial infarction   . Rhabdomyolysis     HOSPITAL COURSE:   1. Elevated troponin: Secondary to demand ischemia as well as poor renal clearance. Followed cardiac enzymes- stable. Monitor telemetry. Ruled out cardiogenic causes of syncope (although it is unclear if the patient syncopized). Echocardiogram ordered. Continue Imdur   Seen by cardiologist- no further work ups advised.   Checked infection work up to rule out as a reason for weakness.   She is found to have Influenza A- Started on tamiflu.  2. Chronic kidney disease: Stage IV; avoid nephrotoxic agents. Hydrate with intravenous saline, renal func stable. 3. Dementia: Continue Zoloft, Aricept and Abilify for mood stabilization. Trazodone for sleep aid 4. Fall: Fall precautions- due to influenza, get PT at [facility] after d/c. 5. Hypertension: Continue amlodipine and Lopressor 6. Ectropion: Continue erythromycin ointment 7. DVT prophylaxis: Heparin 8. GI prophylaxis: None 9. Influenza A- Tamiflu.  DISCHARGE CONDITIONS:   Stable.  CONSULTS OBTAINED:  Treatment Team:  Dalia Heading, MD  DRUG ALLERGIES:   Allergies  Allergen Reactions  . Codeine Other (See Comments)    Reaction: unknown  . Sulfa Antibiotics Hives     DISCHARGE MEDICATIONS:   Current Discharge Medication List    START taking these medications   Details  feeding supplement, ENSURE ENLIVE, (ENSURE ENLIVE) LIQD Take 237 mLs by mouth 2 (two) times daily between meals. Qty: 237 mL, Refills: 12    oseltamivir (TAMIFLU) 30 MG capsule Take 1 capsule (30 mg total) by mouth daily. Qty: 8 capsule, Refills: 0      CONTINUE these medications which have CHANGED   Details  traMADol (ULTRAM) 50 MG tablet Take 0.5 tablets (25 mg total) by mouth every 8 (eight) hours as needed for moderate pain or severe pain. Qty: 20 tablet, Refills: 0      CONTINUE these medications which have NOT CHANGED   Details  acetaminophen (TYLENOL) 500 MG tablet Take 500 mg by mouth every 4 (four) hours as needed for mild pain, fever or headache.    alum & mag hydroxide-simeth (MAALOX/MYLANTA) 200-200-20 MG/5ML suspension Take 30 mLs by mouth every 6 (six) hours as needed for indigestion or heartburn.    amLODipine (NORVASC) 10 MG tablet Take 1 tablet (10 mg total) by mouth daily. Qty: 30 tablet, Refills: 2    ARIPiprazole (ABILIFY) 2 MG tablet Take 2 mg by mouth daily.    calcium carbonate (OS-CAL) 600 MG TABS tablet Take 600 mg by mouth 2 (two) times daily with a meal.    Cholecalciferol (VITAMIN D) 2000 UNITS CAPS Take 2,000 Units by mouth daily.    docusate sodium (COLACE) 100 MG capsule Take 1 capsule (100 mg total) by mouth 2 (two) times daily. Hold for > 2 bowel movements  in a day Qty: 30 capsule, Refills: 0    donepezil (ARICEPT) 10 MG tablet Take 10 mg by mouth at bedtime.    erythromycin ophthalmic ointment Place 1 application into the left eye daily. Apply 2-4 times every day.    guaifenesin (ROBITUSSIN) 100 MG/5ML syrup Take 200 mg by mouth 4 (four) times daily as needed for cough.    isosorbide mononitrate (IMDUR) 30 MG 24 hr tablet Take 1 tablet (30 mg total) by mouth daily. Qty: 30 tablet, Refills: 2    loperamide (IMODIUM) 2 MG capsule  Take 2 mg by mouth as needed for diarrhea or loose stools.    LORazepam (ATIVAN) 0.5 MG tablet Take 0.5 mg by mouth 2 (two) times daily as needed (agitation).    magnesium hydroxide (MILK OF MAGNESIA) 400 MG/5ML suspension Take 30 mLs by mouth at bedtime as needed for mild constipation.    metoprolol (LOPRESSOR) 50 MG tablet Take 1 tablet (50 mg total) by mouth 2 (two) times daily. Qty: 60 tablet, Refills: 0    neomycin-bacitracin-polymyxin (NEOSPORIN) [phone] ointment Apply 1 application topically as needed (wound care).    ondansetron (ZOFRAN) 4 MG tablet Take 4 mg by mouth every 8 (eight) hours as needed for nausea or vomiting.    ranitidine (ZANTAC) 150 MG tablet Take 150 mg by mouth at bedtime.    sertraline (ZOLOFT) 50 MG tablet Take 50 mg by mouth daily.    shark liver oil-cocoa butter (PREPARATION H) 0.25-3-85.5 % suppository Place 1 suppository rectally every 4 (four) hours as needed for hemorrhoids.     traZODone (DESYREL) 50 MG tablet Take 25 mg by mouth at bedtime.         DISCHARGE INSTRUCTIONS:    Follow with PMD, as needed.  If you experience worsening of your admission symptoms, develop shortness of breath, life threatening emergency, suicidal or homicidal thoughts you must seek medical attention immediately by calling 911 or calling your MD immediately  if symptoms less severe.  You Must read complete instructions/literature along with all the possible adverse reactions/side effects for all the Medicines you take and that have been prescribed to you. Take any new Medicines after you have completely understood and accept all the possible adverse reactions/side effects.   Please note  You were cared for by a hospitalist during your hospital stay. If you have any questions about your discharge medications or the care you received while you were in the hospital after you are discharged, you can call the unit and asked to speak with the hospitalist on call if the  hospitalist that took care of you is not available. Once you are discharged, your primary care physician will handle any further medical issues. Please note that NO REFILLS for any discharge medications will be authorized once you are discharged, as it is imperative that you return to your primary care physician (or establish a relationship with a primary care physician if you do not have one) for your aftercare needs so that they can reassess your need for medications and monitor your lab values.    Today   CHIEF COMPLAINT:   Chief Complaint  Patient presents with  . Tremors  . Weakness    HISTORY OF PRESENT ILLNESS:  Shamonica Schadt  is a 82 y.o. female with a known history of coronary artery disease, diabetes and dementia presents to the emergency department after some a fall. It is not clear if this fall was witnessed by the patient has no recollection of  it. Due to dementia she cannot contribute much to her past medical history. Currently she denies pain or weakness. Laboratory evaluation revealed barely positive troponin which prompted the emergency department staff to call the hospitalist service for admission.   VITAL SIGNS:  Blood pressure (!) 169/72, pulse (!) 57, temperature 97.8 F (36.6 C), temperature source Oral, resp. rate 18, height 5\' 5"  (1.651 m), weight 48.1 kg (106 lb), SpO2 100 %.  I/O:   Intake/Output Summary (Last 24 hours) at 03/04/16 1133 Last data filed at 03/03/16 2222  Gross per 24 hour  Intake              923 ml  Output                0 ml  Net              923 ml    PHYSICAL EXAMINATION:   GENERAL:  83 y.o.-year-old patient lying in the bed with no acute distress.  EYES: Pupils equal, round, reactive to light and accommodation. No scleral icterus. Extraocular muscles intact.  HEENT: Head atraumatic, normocephalic. Oropharynx and nasopharynx clear.  NECK:  Supple, no jugular venous distention. No thyroid enlargement, no tenderness.  LUNGS: Normal  breath sounds bilaterally, no wheezing, rales,rhonchi or crepitation. No use of accessory muscles of respiration.  CARDIOVASCULAR: S1, S2 normal. positive murmurs .  ABDOMEN: Soft, nontender, nondistended. Bowel sounds present. No organomegaly or mass.  EXTREMITIES: No pedal edema, cyanosis, or clubbing.  NEUROLOGIC: Cranial nerves II through XII are intact. Muscle strength 3-4/5 in all extremities. Sensation intact. Gait not checked.  PSYCHIATRIC: The patient is alert and oriented x 1.  SKIN: No obvious rash, lesion, or ulcer.   DATA REVIEW:   CBC  Recent Labs Lab 03/02/16 2154  WBC 7.8  HGB 11.2*  HCT 33.8*  PLT 233    Chemistries   Recent Labs Lab 03/04/16 0356  NA 138  K 4.5  CL 106  CO2 25  GLUCOSE 65  BUN 21*  CREATININE 1.87*  CALCIUM 9.1    Cardiac Enzymes  Recent Labs Lab 03/03/16 1558  TROPONINI 0.03*    Microbiology Results  Results for orders placed or performed during the hospital encounter of 03/02/16  MRSA PCR Screening     Status: Abnormal   Collection Time: 03/03/16  5:00 AM  Result Value Ref Range Status   MRSA by PCR POSITIVE (A) NEGATIVE Final    Comment:        The GeneXpert MRSA Assay (FDA approved for NASAL specimens only), is one component of a comprehensive MRSA colonization surveillance program. It is not intended to diagnose MRSA infection nor to guide or monitor treatment for MRSA infections. RESULT CALLED TO, READ BACK BY AND VERIFIED WITH:  TERESA COBLE AT 1610 03/03/16 SDR     RADIOLOGY:  Ct Head Wo Contrast  Result Date: 03/03/2016 CLINICAL DATA:  Initial evaluation for new onset tremors with containing to left. EXAM: CT HEAD WITHOUT CONTRAST TECHNIQUE: Contiguous axial images were obtained from the base of the skull through the vertex without intravenous contrast. COMPARISON:  Prior CT from 02/22/2016. FINDINGS: Brain: Moderate cerebral and cerebellar atrophy with chronic microvascular ischemic disease noted. Scatter  remote lacunar infarcts present within the basal ganglia and right thalamus. Remote left cerebellar infarcts. Changes are stable from previous. No acute intracranial hemorrhage. No evidence for acute large vessel territory infarct. No mass lesion, midline shift or mass effect. Ventricular prominence related to global parenchymal volume  loss without hydrocephalus. No extra-axial fluid collection. Vascular: No hyperdense vessel. Prominent vascular calcifications noted within the carotid siphons and distal vertebral arteries. Skull: Scalp soft tissues within normal limits.  Calvarium intact. Sinuses/Orbits: Globes and orbital soft tissues within normal limits. Moderate mucosal thickening within the partially visualized right maxillary sinus, with additional opacity within the right frontal sinus and ethmoidal air cells. Paranasal sinuses are otherwise clear. No mastoid effusion. IMPRESSION: 1. No acute intracranial process identified. 2. Remote left cerebellar infarcts, with additional remote lacunar infarcts within the bilateral basal ganglia and right thalamus. 3. Moderate cerebral and cerebellar atrophy with chronic microvascular ischemic disease. Electronically Signed   By: Rise MuBenjamin  McClintock M.D.   On: 03/03/2016 00:01    EKG:   Orders placed or performed during the hospital encounter of 03/02/16  . EKG 12-Lead  . EKG 12-Lead  . ED EKG  . ED EKG      Management plans discussed with the patient, family and they are in agreement.  CODE STATUS:     Code Status Orders        Start     Ordered   03/03/16 0416  Do not attempt resuscitation (DNR)  Continuous    Question Answer Comment  In the event of cardiac or respiratory ARREST Do not call a "code blue"   In the event of cardiac or respiratory ARREST Do not perform Intubation, CPR, defibrillation or ACLS   In the event of cardiac or respiratory ARREST Use medication by any route, position, wound care, and other measures to relive pain and  suffering. May use oxygen, suction and manual treatment of airway obstruction as needed for comfort.      03/03/16 0415    Code Status History    Date Active Date Inactive Code Status Order ID Comments User Context   01/02/2016  8:13 AM 01/02/2016  5:54 PM DNR [id number]  Willy EddyPatrick Robinson, MD ED   07/11/2015  5:14 PM 07/14/2015  6:52 PM DNR [id number]  Enid Baasadhika Kalisetti, MD Inpatient   06/23/2015  2:39 PM 06/25/2015  5:59 PM DNR [id number]  Wyatt Hasteavid K Hower, MD ED   05/29/2015  6:17 AM 05/31/2015  5:33 PM DNR [id number]  Arnaldo NatalMichael S Diamond, MD Inpatient    Advance Directive Documentation   Flowsheet Row Most Recent Value  Type of Advance Directive  Out of facility DNR (pink MOST or yellow form)  Pre-existing out of facility DNR order (yellow form or pink MOST form)  Yellow form placed in chart (order not valid for inpatient use)  "MOST" Form in Place?  No data      TOTAL TIME TAKING CARE OF THIS PATIENT: 35 minutes.    Altamese DillingVACHHANI, Javares Kaufhold M.D on 03/04/2016 at 11:33 AM  Between 7am to 6pm - Pager - [phone]  After 6pm go to [email or web address] - password [email or web address]  [facility]  Office  [phone]  CC: Primary care physician; Pcp Not In System   Note: This dictation was prepared with Dragon dictation along with smaller phrase technology. Any transcriptional errors that result from this process are unintentional.

## 2016-03-04 NOTE — Progress Notes (Signed)
Patient is medically stable for D/C back to [facility] today. Per LawyerTracey supervisor at [facility] patient can return today via EMS. RN will call report and arrange EMS for transport. Clinical Child psychotherapistocial Worker (CSW) sent D/C Summary and FL2 to [facility]. CSW contacted patient's daughter Horace PorteousMadge and made her aware of above. Please reconsult if future social work needs arise. CSW signing off.   Baker Hughes IncorporatedBailey Shandrika Ambers, LCSW [phone]

## 2016-03-04 NOTE — Care Management (Signed)
I have notified Tim with [facility] of patient referral/discharge. No further RNCM needs.

## 2016-03-04 NOTE — NC FL2 (Signed)
[place] MEDICAID FL2 LEVEL OF CARE SCREENING TOOL     IDENTIFICATION  Patient Name: Denise MenghiniMolly Neal Birthdate: 1922/01/11 Sex: female Admission Date (Current Location): 03/02/2016  [place]   Facility and Address:  [facility], [address], [place], [place] [address]      Provider Number: [id number]  Attending Physician Name and Address:  Altamese DillingVaibhavkumar Jarmarcus Wambold, MD  Relative Name and Phone Number:       Current Level of Care: Hospital Recommended Level of Care: Assisted Living Facility [facility] Prior Approval Number:    Date Approved/Denied:   PASRR Number:    Discharge Plan: Other (Comment) ([facility])    Current Diagnoses: Primary: Dementia  Patient Active Problem List   Diagnosis Date Noted  . Elevated troponin 03/03/2016  . ARF (acute renal failure) (HCC) 07/11/2015  . UTI (lower urinary tract infection) 06/23/2015  . Cholecystitis 05/29/2015  . Pressure ulcer 05/29/2015  . Old myocardial infarction 08/29/2013  . Essential hypertension 08/29/2013  . Hyperlipidemia 08/29/2013    Orientation RESPIRATION BLADDER Height & Weight     Self  Normal Incontinent Weight: 106 lb (48.1 kg) Height:  5\' 5"  (165.1 cm)  BEHAVIORAL SYMPTOMS/MOOD NEUROLOGICAL BOWEL NUTRITION STATUS   (None. )  (None.) Continent Diet (Diet: Heart Room )  AMBULATORY STATUS COMMUNICATION OF NEEDS Skin   Limited Assist Verbally Normal                       Personal Care Assistance Level of Assistance  Bathing, Feeding, Dressing Bathing Assistance: Limited assistance Feeding assistance: Independent Dressing Assistance: Limited assistance     Functional Limitations Info  Sight, Hearing, Speech Sight Info: Adequate Hearing Info: Adequate Speech Info: Impaired (Missing Teeth )    SPECIAL CARE FACTORS FREQUENCY                      Contractures      Additional Factors Info  Code Status,  Allergies Code Status Info:  (DNR) Allergies Info:  (Codeine, Sulfa Antibiotics)          Discharge Medications: Please see discharge summary for a list of discharge medications. Current Discharge Medication List       START taking these medications   Details  feeding supplement, ENSURE ENLIVE, (ENSURE ENLIVE) LIQD Take 237 mLs by mouth 2 (two) times daily between meals. Qty: 237 mL, Refills: 12    oseltamivir (TAMIFLU) 30 MG capsule Take 1 capsule (30 mg total) by mouth daily. Qty: 8 capsule, Refills: 0         CONTINUE these medications which have CHANGED   Details  traMADol (ULTRAM) 50 MG tablet Take 0.5 tablets (25 mg total) by mouth every 8 (eight) hours as needed for moderate pain or severe pain. Qty: 20 tablet, Refills: 0         CONTINUE these medications which have NOT CHANGED   Details  acetaminophen (TYLENOL) 500 MG tablet Take 500 mg by mouth every 4 (four) hours as needed for mild pain, fever or headache.    alum & mag hydroxide-simeth (MAALOX/MYLANTA) 200-200-20 MG/5ML suspension Take 30 mLs by mouth every 6 (six) hours as needed for indigestion or heartburn.    amLODipine (NORVASC) 10 MG tablet Take 1 tablet (10 mg total) by mouth daily. Qty: 30 tablet, Refills: 2    ARIPiprazole (ABILIFY) 2 MG tablet Take 2 mg by mouth daily.    calcium carbonate (  OS-CAL) 600 MG TABS tablet Take 600 mg by mouth 2 (two) times daily with a meal.    Cholecalciferol (VITAMIN D) 2000 UNITS CAPS Take 2,000 Units by mouth daily.    docusate sodium (COLACE) 100 MG capsule Take 1 capsule (100 mg total) by mouth 2 (two) times daily. Hold for > 2 bowel movements in a day Qty: 30 capsule, Refills: 0    donepezil (ARICEPT) 10 MG tablet Take 10 mg by mouth at bedtime.    erythromycin ophthalmic ointment Place 1 application into the left eye daily. Apply 2-4 times every day.    guaifenesin (ROBITUSSIN) 100 MG/5ML syrup Take 200 mg by mouth 4 (four) times daily as  needed for cough.    isosorbide mononitrate (IMDUR) 30 MG 24 hr tablet Take 1 tablet (30 mg total) by mouth daily. Qty: 30 tablet, Refills: 2    loperamide (IMODIUM) 2 MG capsule Take 2 mg by mouth as needed for diarrhea or loose stools.    LORazepam (ATIVAN) 0.5 MG tablet Take 0.5 mg by mouth 2 (two) times daily as needed (agitation).    magnesium hydroxide (MILK OF MAGNESIA) 400 MG/5ML suspension Take 30 mLs by mouth at bedtime as needed for mild constipation.    metoprolol (LOPRESSOR) 50 MG tablet Take 1 tablet (50 mg total) by mouth 2 (two) times daily. Qty: 60 tablet, Refills: 0    neomycin-bacitracin-polymyxin (NEOSPORIN) [phone] ointment Apply 1 application topically as needed (wound care).    ondansetron (ZOFRAN) 4 MG tablet Take 4 mg by mouth every 8 (eight) hours as needed for nausea or vomiting.    ranitidine (ZANTAC) 150 MG tablet Take 150 mg by mouth at bedtime.    sertraline (ZOLOFT) 50 MG tablet Take 50 mg by mouth daily.    shark liver oil-cocoa butter (PREPARATION H) 0.25-3-85.5 % suppository Place 1 suppository rectally every 4 (four) hours as needed for hemorrhoids.     traZODone (DESYREL) 50 MG tablet Take 25 mg by mouth at bedtime.    Relevant Imaging Results: Relevant Lab Results: Additional Information  (SSN: [id number])  Sample, Darleen Crocker, LCSW

## 2016-05-08 DEATH — deceased

## 2017-09-29 IMAGING — CT CT HEAD W/O CM
4 of 7 series · 15 of 47 positions shown, 16 images · non-contrast
Comparison: CT head and cervical spine dated 01/02/2016 and
05/08/2008.

CLINICAL DATA: [AGE]/o  F; fall with laceration to the left temple.

EXAM:
CT HEAD WITHOUT CONTRAST
CT CERVICAL SPINE WITHOUT CONTRAST
TECHNIQUE: Multidetector CT imaging of the head and cervical spine was
performed following the standard protocol without intravenous
contrast. Multiplanar CT image reconstructions of the cervical spine
were also generated.

[Series 2: head wo · axial · 0.47mm/px · z∈[-123,-43]mm · 3 of 32 slices shown, 4 images]
[im 8/32  brain]
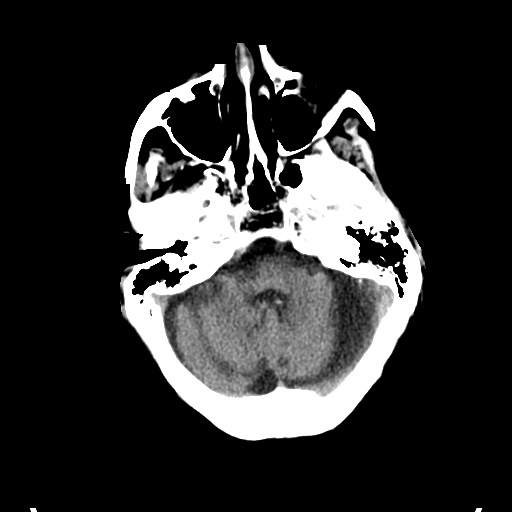
[im 8/32  bone]
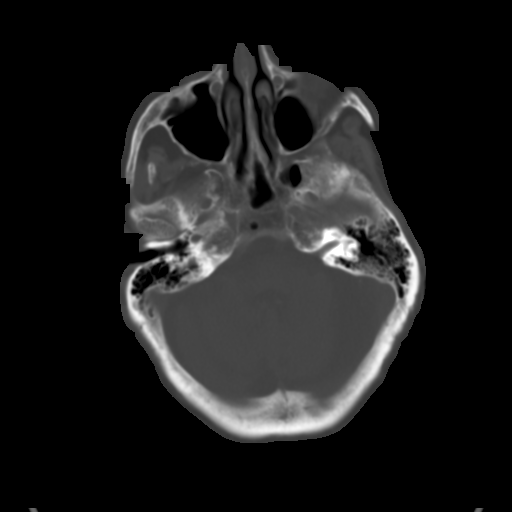
[im 16/32  brain]
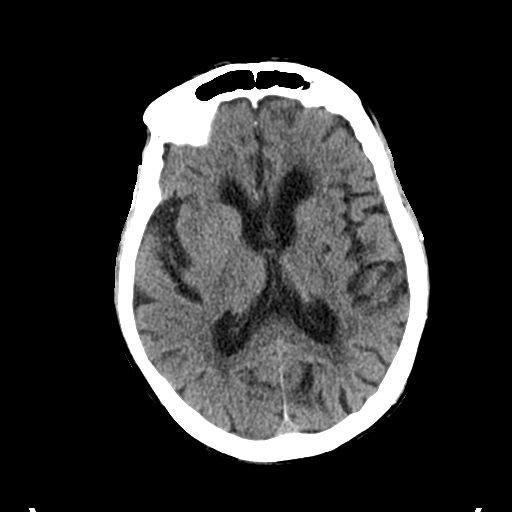
[im 24/32  brain]
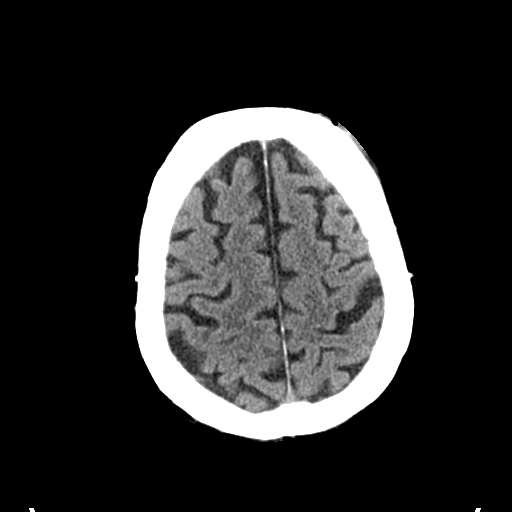

[Series 8: coronal soft tissue · coronal · 0.31mm/px · 3 of 65 slices shown]
[im 22/65  brain]
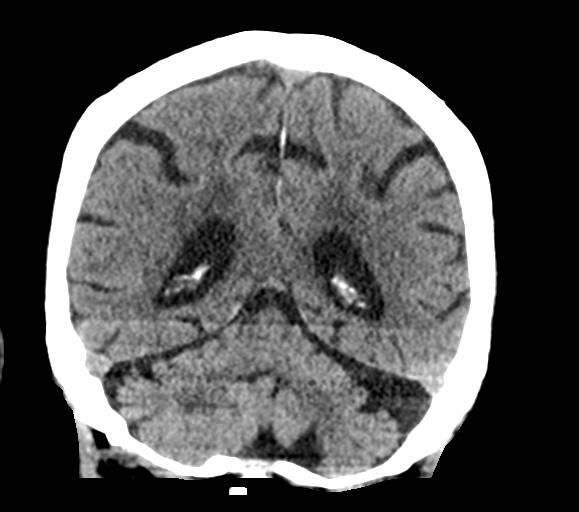
[im 33/65  brain]
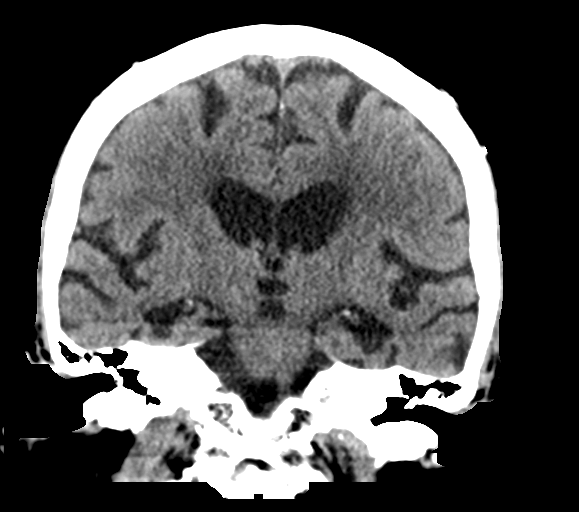
[im 43/65  brain]
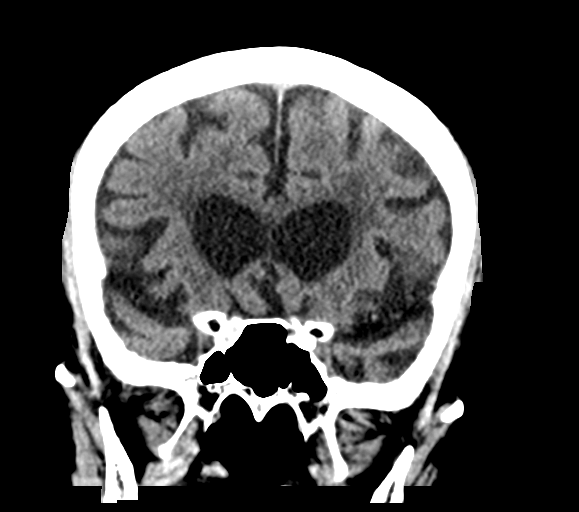

[Series 9: sagittal soft tissue · sagittal · 0.32mm/px · 1 of 49 slices shown]
[im 25/49  brain]
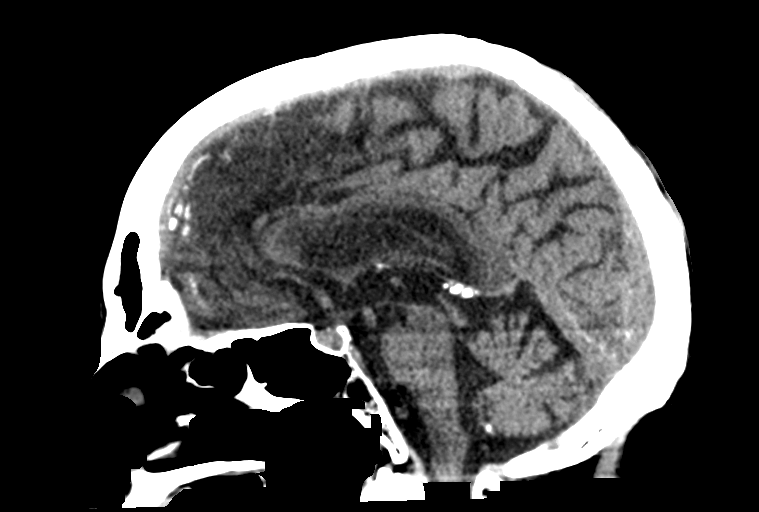

[Series 12: orthogonal bone · axial · 0.30mm/px · z∈[-323,-181]mm · 8 of 95 slices shown]
[im 8/95  bone]
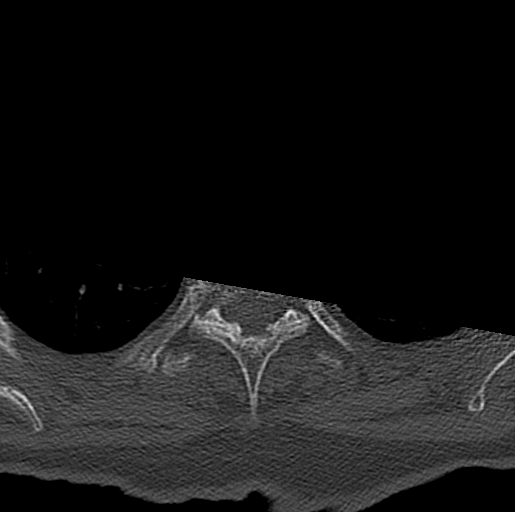
[im 24/95  bone]
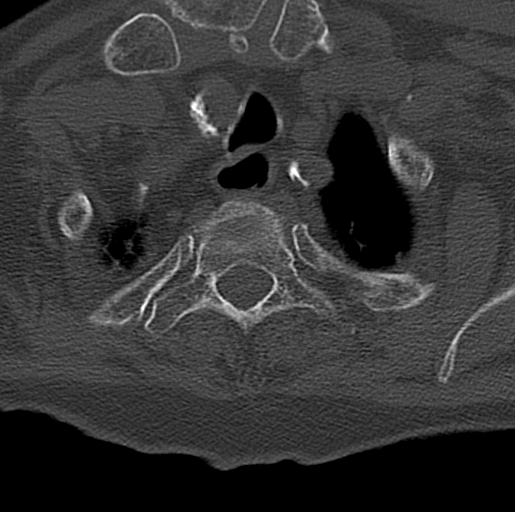
[im 32/95  bone]
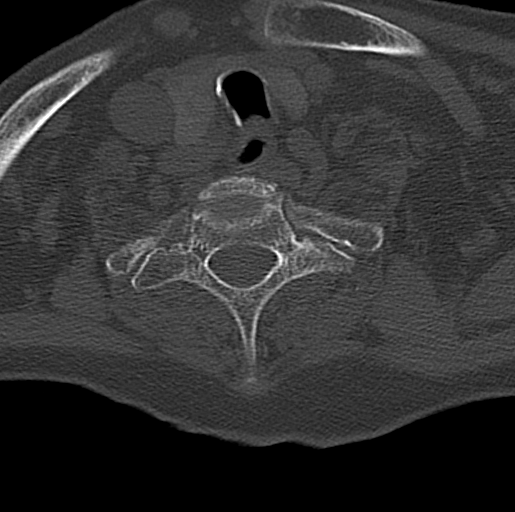
[im 40/95  bone]
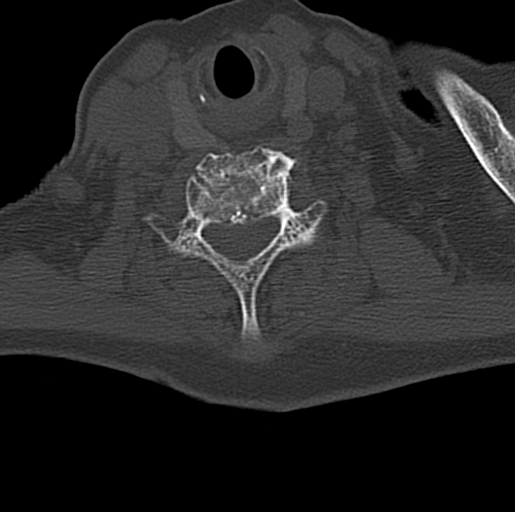
[im 55/95  bone]
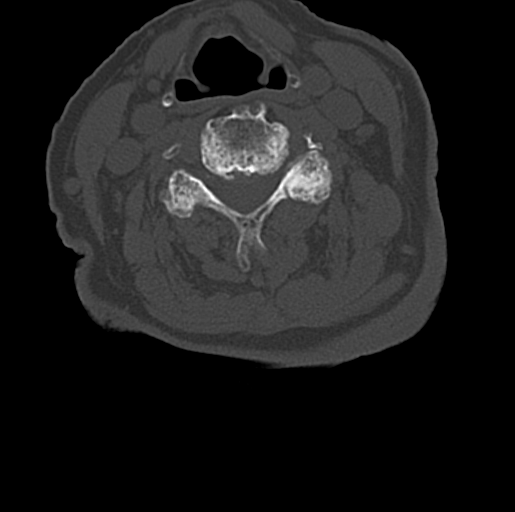
[im 63/95  bone]
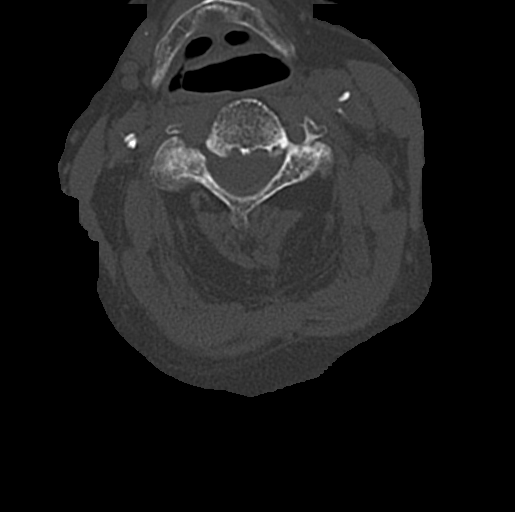
[im 71/95  bone]
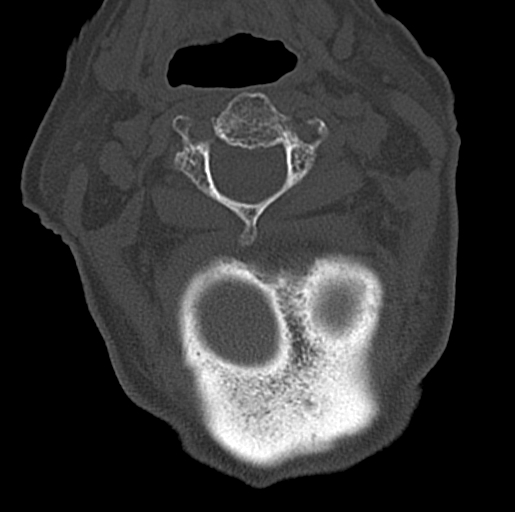
[im 87/95  bone]
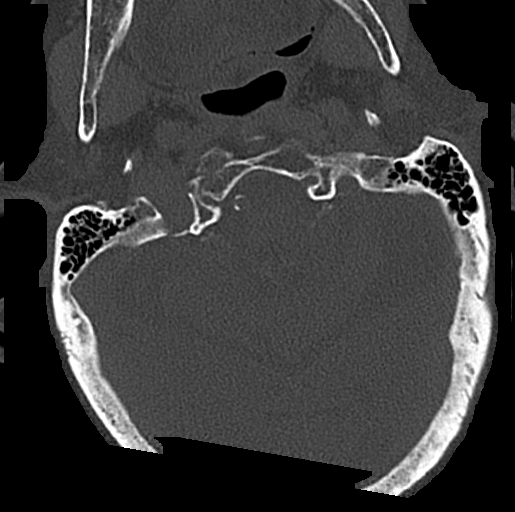

[15 of 47 positions shown; findings below may reference images not displayed]

FINDINGS: CT HEAD FINDINGS

Brain: No evidence of acute infarction, hemorrhage, hydrocephalus,
extra-axial collection or mass lesion/mass effect. Advanced chronic
microvascular ischemic changes including several lacunar infarcts in
the lentiform nuclei, right thalamus, and left corona radiata.
Moderate brain parenchymal volume loss. Prominent extra-axial space
inferior to the left tentorium cerebelli may represent a chronic
hygroma or arachnoid cyst unchanged from 6939.

Vascular: No hyperdense vessel. Extensive calcific atherosclerosis
of cavernous internal carotid arteries and vertebrobasilar system.

Skull: Diminution in parietal scalp contusion. New left frontal
scalp contusion with laceration. No displaced calvarial fracture.

Sinuses/Orbits: No acute finding.

Other: None.

CT CERVICAL SPINE FINDINGS

Alignment: Normal.

Skull base and vertebrae: No acute fracture. No primary bone lesion
or focal pathologic process.

Soft tissues and spinal canal: No prevertebral fluid or swelling. No
visible canal hematoma.

Disc levels: Moderate cervical spondylosis with predominantly
discogenic degenerative changes greatest from the C4 through C7
levels. Canal stenosis is greatest at C6-7 with there is a disc
osteophyte complex and probable moderate underlying canal stenosis.
There is facet and ligamentum flavum hypertrophy and C3-4 and C4-5
levels with mild to moderate bony foraminal narrowing.

Upper chest: Negative.

Other: Extensive calcific atherosclerosis of the carotid
bifurcations bilaterally nodule within the right lobe of thyroid
measuring 13 mm.
IMPRESSION: 1. No acute intracranial abnormality.
2. Advanced chronic microvascular ischemic changes and moderate
brain parenchymal volume loss.
3. New left frontal scalp contusion with laceration. No displaced
calvarial fracture.
4. No acute fracture or dislocation of the cervical spine.
5. Moderate cervical spondylosis without high-grade bony canal
stenosis.
6. Extensive calcific atherosclerosis of bilateral carotid
bifurcations.

By: Mariza Mccann M.D.
